# Patient Record
Sex: Female | Born: 1960 | Race: White | Hispanic: No | Marital: Married | Smoking: Never smoker
Health system: Southern US, Community
[De-identification: ages and names within clinical notes are randomized; demographics above are authoritative.]

## PROBLEM LIST (undated history)

## (undated) DIAGNOSIS — M1990 Unspecified osteoarthritis, unspecified site: Secondary | ICD-10-CM

## (undated) DIAGNOSIS — K219 Gastro-esophageal reflux disease without esophagitis: Secondary | ICD-10-CM

## (undated) HISTORY — PX: CHOLECYSTECTOMY: SHX55

## (undated) HISTORY — PX: APPENDECTOMY: SHX54

---

## 1998-07-20 ENCOUNTER — Emergency Department (HOSPITAL_COMMUNITY): Admission: EM | Admit: 1998-07-20 | Discharge: 1998-07-20 | Payer: Self-pay | Admitting: Emergency Medicine

## 2004-07-06 ENCOUNTER — Ambulatory Visit (HOSPITAL_COMMUNITY): Admission: RE | Admit: 2004-07-06 | Discharge: 2004-07-06 | Payer: Self-pay | Admitting: Family Medicine

## 2020-03-20 ENCOUNTER — Ambulatory Visit: Payer: Self-pay | Admitting: Family Medicine

## 2020-03-20 ENCOUNTER — Encounter: Payer: Self-pay | Admitting: Family Medicine

## 2020-03-20 ENCOUNTER — Other Ambulatory Visit: Payer: Self-pay

## 2020-03-20 DIAGNOSIS — M5126 Other intervertebral disc displacement, lumbar region: Secondary | ICD-10-CM

## 2020-03-20 MED ORDER — TRAMADOL HCL 50 MG PO TABS
50.0000 mg | ORAL_TABLET | Freq: Four times a day (QID) | ORAL | 0 refills | Status: DC | PRN
Start: 2020-03-20 — End: 2020-04-21

## 2020-03-20 NOTE — Progress Notes (Signed)
   Office Visit Note   Patient: Jean Tanner           Date of Birth: 1960/12/10           MRN: [id number] Visit Date: 03/20/2020 Requested by: No referring provider defined for this encounter. PCP: No primary care provider on file.  Subjective: Chief Complaint  Patient presents with  . Lower Back - Pain    Pain started in the right hip and leg since April 2020. Shooting pain down the leg (lateral thigh to the knee). No numbness/tingling. Brought [facility] MRI CD.    HPI: She is here with low back and right leg pain.  Symptoms started around April 2020.  She was living in [place] at that time, there was no injury to cause her pain.  She began seeing an orthopedist who ordered an MRI scan showing a lumbar disc protrusion.  She has been working with an Ship broker and a Land and has gotten temporary relief.  Her mother's health has not been good so she and her husband recently moved back here to help care for her mother.  Her pain seems to be getting worse, she is walking with a limp.  Her quality of life is not good because of her pain.  She is taking tramadol fairly regularly and Flexeril.  She is an Dealer.  She also wants to establish primary care in the near future.                ROS:   All other systems were reviewed and are negative.  Objective: Vital Signs: There were no vitals taken for this visit.  Physical Exam:  General:  Alert and oriented, in no acute distress. Pulm:  Breathing unlabored. Psy:  Normal mood, congruent affect.  She walks with an antalgic gait. Low back: She does have some tenderness in the right posterior hip.  No significant midline tenderness.  She has some stiffness with passive hip internal rotation but this does not cause severe pain.  She has more pain with external rotation.  Lower extremity strength and reflexes are normal.    Imaging: She brought an MRI on CD showing a couple disc protrusions with stenosis.   Assessment &  Plan: 1.  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.  She would also like to be referred for an epidural steroid injection.  Refilled tramadol, gave her baclofen to take as needed.      Procedures: No procedures performed  No notes on file     PMFS History: There are no problems to display for this patient.  History reviewed. No pertinent past medical history.  History reviewed. No pertinent family history.  History reviewed. No pertinent surgical history. Social History   Occupational History  . Not on file  Tobacco Use  . Smoking status: Not on file  Substance and Sexual Activity  . Alcohol use: Not on file  . Drug use: Not on file  . Sexual activity: Not on file

## 2020-03-23 MED ORDER — BACLOFEN 10 MG PO TABS
5.0000 mg | ORAL_TABLET | Freq: Three times a day (TID) | ORAL | 3 refills | Status: DC | PRN
Start: 2020-03-23 — End: 2020-04-21

## 2020-03-24 ENCOUNTER — Telehealth: Payer: Self-pay | Admitting: Physical Medicine and Rehabilitation

## 2020-03-24 NOTE — Telephone Encounter (Signed)
Pt called checking on the status of getting an apt with Dr. Alvester Morin.   Pt is ready for scheduling and is awaiting a call

## 2020-03-24 NOTE — Telephone Encounter (Signed)
Pt called stating she was waiting for a call to set an appt but the call went to her husband so she missed it; pt would like to have her cell number called next CB please  [phone]

## 2020-03-24 NOTE — Telephone Encounter (Signed)
Called pt back the number that the pt has in her file isnt correct. Jean Tanner been calling her husband line to have her to call us back. Pt needs to updated her phone number.

## 2020-04-01 ENCOUNTER — Telehealth: Payer: Self-pay | Admitting: Physical Medicine and Rehabilitation

## 2020-04-01 NOTE — Telephone Encounter (Signed)
Called pt back and she verify her appt.

## 2020-04-01 NOTE — Telephone Encounter (Signed)
Patient called requesting a call back. Patient has questions about upcoming injections. Please call patient at [phone].

## 2020-04-02 ENCOUNTER — Ambulatory Visit: Payer: Self-pay

## 2020-04-02 ENCOUNTER — Other Ambulatory Visit: Payer: Self-pay

## 2020-04-02 ENCOUNTER — Ambulatory Visit: Payer: Self-pay | Admitting: Physical Medicine and Rehabilitation

## 2020-04-02 ENCOUNTER — Encounter: Payer: Self-pay | Admitting: Physical Medicine and Rehabilitation

## 2020-04-02 VITALS — BP 157/100 | HR 75

## 2020-04-02 DIAGNOSIS — M25551 Pain in right hip: Secondary | ICD-10-CM

## 2020-04-02 MED ORDER — BUPIVACAINE HCL 0.25 % IJ SOLN
4.0000 mL | INTRAMUSCULAR | Status: AC | PRN
  Administered 2020-04-02: 4 mL via INTRA_ARTICULAR

## 2020-04-02 MED ORDER — TRIAMCINOLONE ACETONIDE 40 MG/ML IJ SUSP
60.0000 mg | INTRAMUSCULAR | Status: AC | PRN
  Administered 2020-04-02: 60 mg via INTRA_ARTICULAR

## 2020-04-02 NOTE — Progress Notes (Signed)
Jean Tanner - 59 y.o. female MRN [id number]  Date of birth: Dec 17, 1960  Office Visit Note: Visit Date: 04/02/2020 PCP: No primary care provider on file. Referred by: Lavada Mesi, MD  Subjective: Chief Complaint  Patient presents with  . Lower Back - Pain  . Right Hip - Pain  . Right Leg - Pain   HPI:  Jean Tanner is a 59 y.o. female who comes in today For possible L5 transforaminal epidural steroid injection at the request of Dr. Casimiro Needle Hilts.  Patient is present with her husband who is a family doctor.  She has brought MRI CD to Dr. Prince Rome who dictated that there were a couple of disc protrusions but he did not note any specific findings.  I had asked during the referral process to have the CD available but she reports that she really did not get that notification.  With talking with her and her husband today she has a lot of pain in the anterior lateral aspect of the right hip area into the groin down to the knee some pain on the lateral aspect.  Worse getting in and out of the car and walking going from sit to stand.  Once on occasion she will have more of an L4 distribution then in the medial part of the knee.  She reports prior evaluation of her hip including MRI of the hip but we do not have that to look at.  She was told she had some arthritis but she did not think she was having pain from her hip because she did have a hip injection at that practice that she reports "did not touch it ".  Everything clinically today really points to her hip as a source of pain.  She has Trendelenburg gait with ambulation she has pain with internal rotation and external.  Dr. Prince Rome looked at her hip a little bit when he saw her and she did have pain with external rotation as well.  She has decent movement of the hip although it is somewhat tight.  We decided today to elect to do diagnostic and hopefully therapeutic intra-articular hip injection.  ROS Otherwise per HPI.  Assessment &  Plan: Visit Diagnoses: No diagnosis found.  Plan: No additional findings.   Meds & Orders: No orders of the defined types were placed in this encounter.  No orders of the defined types were placed in this encounter.   Follow-up: No follow-ups on file.   Procedures: Large Joint Inj: R hip joint on 04/02/2020 3:02 PM Indications: diagnostic evaluation and pain Details: 22 G 3.5 in needle, fluoroscopy-guided anterior approach  Arthrogram: No  Medications: 4 mL bupivacaine 0.25 %; 60 mg triamcinolone acetonide 40 MG/ML Outcome: tolerated well, no immediate complications  There was excellent flow of contrast producing a partial arthrogram of the hip. The patient did have relief of symptoms during the anesthetic phase of the injection. Procedure, treatment alternatives, risks and benefits explained, specific risks discussed. Consent was given by the patient. Immediately prior to procedure a time out was called to verify the correct patient, procedure, equipment, support staff and site/side marked as required. Patient was prepped and draped in the usual sterile fashion.          Clinical History: No specialty comments available.     Objective:  VS:  HT:    WT:   BMI:     BP:(!) 157/100  HR:75bpm  TEMP: ( )  RESP:  Physical Exam   Imaging:  No results found.

## 2020-04-02 NOTE — Progress Notes (Signed)
Pt state lower back pain that travels down her right hip to her right leg. Pt state walking makes the pain worse. Pt state she takes pain meds and she sits down to help ease the pain.  Numeric Pain Rating Scale and Functional Assessment Average Pain 5   In the last MONTH (on 0-10 scale) has pain interfered with the following?  1. General activity like being  able to carry out your everyday physical activities such as walking, climbing stairs, carrying groceries, or moving a chair?  Rating(9)   +Driver, -BT, -Dye Allergies.

## 2020-04-06 ENCOUNTER — Telehealth: Payer: Self-pay | Admitting: Family Medicine

## 2020-04-06 MED ORDER — AMOXICILLIN-POT CLAVULANATE 875-125 MG PO TABS: 1.0000 | ORAL_TABLET | Freq: Two times a day (BID) | ORAL | 0 refills | Status: DC

## 2020-04-06 NOTE — Telephone Encounter (Signed)
I called and advised her it was sent to [facility].

## 2020-04-06 NOTE — Telephone Encounter (Signed)
Please advise 

## 2020-04-06 NOTE — Telephone Encounter (Signed)
Augmentin

## 2020-04-06 NOTE — Telephone Encounter (Signed)
Rx sent.  Better to fast for blood draw.

## 2020-04-06 NOTE — Telephone Encounter (Signed)
I called - medication sent in. Fasting would be best for the lab work (drink plenty of fluids, though).

## 2020-04-06 NOTE — Telephone Encounter (Signed)
Pt called stating she has a Sinus infection and would like some medication sent in for it.  I scheduled an apt for the pt to establish care for pt she has been seen prior for her hip.   She is requesting to have Augmentin called in so she can be well before christmas.   Pt would also like to know if she needs to fast before her apt on the 14 at 8:20 if she is going to have a blood draw

## 2020-04-06 NOTE — Addendum Note (Signed)
Addended by: Lillia Carmel on: 04/06/2020 04:50 PM   Modules accepted: Orders

## 2020-04-06 NOTE — Telephone Encounter (Signed)
Pt called and her prescription was called to [facility] on [address] but it needed to be sent to [facility] and the number is [phone] for the pharmacy.

## 2020-04-21 ENCOUNTER — Encounter: Payer: Self-pay | Admitting: Family Medicine

## 2020-04-21 MED ORDER — BACLOFEN 10 MG PO TABS: 5.0000 mg | ORAL_TABLET | Freq: Three times a day (TID) | ORAL | 3 refills | Status: AC | PRN

## 2020-04-21 MED ORDER — TRAMADOL HCL 50 MG PO TABS: 50.0000 mg | ORAL_TABLET | Freq: Four times a day (QID) | ORAL | 0 refills | Status: DC | PRN

## 2020-04-27 ENCOUNTER — Ambulatory Visit: Payer: Self-pay | Admitting: Physical Medicine and Rehabilitation

## 2020-05-01 ENCOUNTER — Ambulatory Visit (INDEPENDENT_AMBULATORY_CARE_PROVIDER_SITE_OTHER): Payer: Self-pay | Admitting: Family Medicine

## 2020-05-01 ENCOUNTER — Other Ambulatory Visit: Payer: Self-pay

## 2020-05-01 ENCOUNTER — Encounter: Payer: Self-pay | Admitting: Family Medicine

## 2020-05-01 VITALS — BP 160/94 | HR 72 | Ht 65.0 in | Wt 261.4 lb

## 2020-05-01 DIAGNOSIS — E559 Vitamin D deficiency, unspecified: Secondary | ICD-10-CM

## 2020-05-01 DIAGNOSIS — Z0000 Encounter for general adult medical examination without abnormal findings: Secondary | ICD-10-CM

## 2020-05-01 DIAGNOSIS — M25551 Pain in right hip: Secondary | ICD-10-CM

## 2020-05-01 DIAGNOSIS — Z6841 Body Mass Index (BMI) 40.0 and over, adult: Secondary | ICD-10-CM

## 2020-05-01 DIAGNOSIS — Z8262 Family history of osteoporosis: Secondary | ICD-10-CM

## 2020-05-01 MED ORDER — TRAMADOL HCL ER 100 MG PO TB24: 100.0000 mg | ORAL_TABLET | Freq: Every day | ORAL | 3 refills | Status: DC

## 2020-05-01 NOTE — Progress Notes (Signed)
Office Visit Note   Patient: Jean Tanner           Date of Birth: [birth year]           MRN: [id number] Visit Date: 05/01/2020 Requested by: No referring provider defined for this encounter. PCP: No primary care provider on file.  Subjective: Chief Complaint  Patient presents with  . establish primary care    Right hip is no better post injection from Dr. Alvester Morin. Would like to discuss hip replacement &/or knee replacement surgeries  BP has been running high, especially with the hip & knee pain    HPI: She is here for a wellness exam.  Her last exam was about 2 years ago.  She is still struggling with ongoing hip pain.  She had a diagnostic injection per Dr. Alvester Morin.  She states that for the next 5 days she felt great.  She was even able to walk up steps without pain.  But the relief was very short-lived and now she is hurting almost more than she was before.  The pain travels toward the knee.  She has been struggling with obesity.  Her current BMI is 43.5.  Based on her height of 5 foot 5 inches, she needs to weigh 240 pounds in order to qualify for a joint replacement.  Her blood pressure has been periodically elevated.  She does not want to take medication for this.  She thinks that if she loses weight, her pressure will normalize.  Family history was reviewed in detail.  She is up-to-date on colonoscopy which was normal.  She is up-to-date on eye exams and dental exams.  She does not get mammograms.  She does self exams, and her husband who is a physician also does periodic exams.                ROS:   All other systems were reviewed and are negative.  Objective: Vital Signs: BP (!) 160/94   Pulse 72   Ht 5\' 5"  (1.651 m)   Wt 261 lb 6.4 oz (118.6 kg)   BMI 43.50 kg/m   Physical Exam:  General:  Alert and oriented, in no acute distress. Pulm:  Breathing unlabored. Psy:  Normal mood, congruent affect. Skin: No suspicious lesions HEENT:  [place]/AT, PERRLA, EOM Full, no  nystagmus.  Funduscopic examination within normal limits.  No conjunctival erythema.  Tympanic membranes are pearly gray with normal landmarks.  External ear canals are normal.  Nasal passages are clear.  Oropharynx is clear.  No significant lymphadenopathy.  No thyromegaly or nodules.  2+ carotid pulses without bruits. CV: Regular rate and rhythm without murmurs, rubs, or gallops.  No peripheral edema.  2+ radial and posterior tibial pulses. Lungs: Clear to auscultation throughout with no wheezing or areas of consolidation. Abd: Bowel sounds are active, no hepatosplenomegaly or masses.  Soft and nontender.  No audible bruits.  No evidence of ascites. Extremities: 2+ upper and 1+ lower extremity DTRs.  No nail deformities.   Imaging: No results found.  Assessment & Plan: 1. wellness exam -Labs drawn today.  2.  Obesity -She will start minimizing intake of processed carbohydrates.  3. right hip pain with DJD -She will most likely need a replacement.  We will treat with extended release tramadol, with immediate release for breakthrough pain.  She will continue to work aggressively on weight loss.  Once she reaches BMI below 40, we will have her consult with Dr. .  4. vitamin  D deficiency -We will screen for this.  She is presently taking 5000 units daily.     Procedures: No procedures performed        PMFS History: There are no problems to display for this patient.  No past medical history on file.  Family History  Problem Relation Age of Onset  . Heart disease Mother   . Arthritis Mother   . Osteoporosis Mother   . Hypothyroidism Mother   . Cancer Father   . Obesity Father   . Diabetes Father   . Lung cancer Father   . Healthy Sister   . Healthy Brother   . Diabetes Paternal Grandfather   . Healthy Sister     No past surgical history on file. Social History   Occupational History  . Not on file  Tobacco Use  . Smoking status: Not on file  . Smokeless  tobacco: Not on file  Substance and Sexual Activity  . Alcohol use: Not on file  . Drug use: Not on file  . Sexual activity: Not on file

## 2020-05-02 LAB — LIPID PANEL
Cholesterol: 303 mg/dL — ABNORMAL HIGH (ref <200)
HDL: 78 mg/dL (ref >=50)
LDL Cholesterol (Calc): 207 mg/dL (calc) — ABNORMAL HIGH
Non-HDL Cholesterol (Calc): 225 mg/dL (calc) — ABNORMAL HIGH (ref <130)
Total CHOL/HDL Ratio: 3.9 (calc) (ref <5.0)
Triglycerides: 72 mg/dL (ref <150)

## 2020-05-02 LAB — COMPREHENSIVE METABOLIC PANEL
AG Ratio: 1.7 (calc) (ref 1.0–2.5)
ALT: 39 U/L — ABNORMAL HIGH (ref 6–29)
AST: 24 U/L (ref 10–35)
Albumin: 4.3 g/dL (ref 3.6–5.1)
Alkaline phosphatase (APISO): 80 U/L (ref 37–153)
BUN: 14 mg/dL (ref 7–25)
CO2: 29 mmol/L (ref 20–32)
Calcium: 9.6 mg/dL (ref 8.6–10.4)
Chloride: 101 mmol/L (ref 98–110)
Creat: 0.72 mg/dL (ref 0.50–1.05)
Globulin: 2.5 g/dL (calc) (ref 1.9–3.7)
Glucose, Bld: 86 mg/dL (ref 65–99)
Potassium: 4.3 mmol/L (ref 3.5–5.3)
Sodium: 138 mmol/L (ref 135–146)
Total Bilirubin: 0.5 mg/dL (ref 0.2–1.2)
Total Protein: 6.8 g/dL (ref 6.1–8.1)

## 2020-05-02 LAB — CBC WITH DIFFERENTIAL/PLATELET
Absolute Monocytes: 317 cells/uL (ref 200–950)
Basophils Absolute: 38 cells/uL (ref 0–200)
Basophils Relative: 0.8 %
Eosinophils Absolute: 206 cells/uL (ref 15–500)
Eosinophils Relative: 4.3 %
HCT: 40.1 % (ref 35.0–45.0)
Hemoglobin: 13.9 g/dL (ref 11.7–15.5)
Lymphs Abs: 2083 cells/uL (ref 850–3900)
MCH: 32.3 pg (ref 27.0–33.0)
MCHC: 34.7 g/dL (ref 32.0–36.0)
MCV: 93.3 fL (ref 80.0–100.0)
MPV: 9.6 fL (ref 7.5–12.5)
Monocytes Relative: 6.6 %
Neutro Abs: 2155 cells/uL (ref 1500–7800)
Neutrophils Relative %: 44.9 %
Platelets: 309 10*3/uL (ref 140–400)
RBC: 4.3 10*6/uL (ref 3.80–5.10)
RDW: 13.1 % (ref 11.0–15.0)
Total Lymphocyte: 43.4 %
WBC: 4.8 10*3/uL (ref 3.8–10.8)

## 2020-05-02 LAB — THYROID PANEL WITH TSH
Free Thyroxine Index: 2.9 (ref 1.4–3.8)
T3 Uptake: 28 % (ref 22–35)
T4, Total: 10.4 ug/dL (ref 5.1–11.9)
TSH: 2.22 mIU/L (ref 0.40–4.50)

## 2020-05-02 LAB — VITAMIN D 25 HYDROXY (VIT D DEFICIENCY, FRACTURES): Vit D, 25-Hydroxy: 30 ng/mL (ref 30–100)

## 2020-05-02 LAB — HIGH SENSITIVITY CRP: hs-CRP: 8.7 mg/L — ABNORMAL HIGH

## 2020-05-02 LAB — HEMOGLOBIN A1C
Hgb A1c MFr Bld: 5.7 % of total Hgb — ABNORMAL HIGH (ref <5.7)
Mean Plasma Glucose: 117 mg/dL
eAG (mmol/L): 6.5 mmol/L

## 2020-05-03 ENCOUNTER — Telehealth: Payer: Self-pay | Admitting: Family Medicine

## 2020-05-03 NOTE — Telephone Encounter (Signed)
Vitamin D is low (goal level is 50-80).  I recommend an additional 2,000-5,000 IU daily.  Recheck in 6 months.  CBC looks good.  CMET virtually normal - very slight elevation of ALT.  Will probably resolve with weight loss.  CRP is elevated (inflammation).  Lipids are elevated (total and LDL), but HDL and TG look great.  Could consider ordering a "fractionated lipid panel" to further evaluate, or possibly a CT Calcium Score test (to look for signs of plaque build-up in the coronary arteries).  Thyroid function looks normal.  A1C is in prediabetes range.  Should be manageable with diet & exercise.

## 2020-06-10 ENCOUNTER — Encounter: Payer: Self-pay | Admitting: Family Medicine

## 2020-06-10 MED ORDER — TRAMADOL HCL 50 MG PO TABS: 50.0000 mg | ORAL_TABLET | Freq: Three times a day (TID) | ORAL | 0 refills | Status: DC | PRN

## 2020-06-11 ENCOUNTER — Ambulatory Visit (INDEPENDENT_AMBULATORY_CARE_PROVIDER_SITE_OTHER): Payer: Self-pay

## 2020-06-11 ENCOUNTER — Ambulatory Visit (INDEPENDENT_AMBULATORY_CARE_PROVIDER_SITE_OTHER): Payer: Self-pay | Admitting: Orthopaedic Surgery

## 2020-06-11 ENCOUNTER — Ambulatory Visit: Payer: Self-pay

## 2020-06-11 ENCOUNTER — Encounter: Payer: Self-pay | Admitting: Orthopaedic Surgery

## 2020-06-11 VITALS — Ht 65.0 in | Wt 261.0 lb

## 2020-06-11 DIAGNOSIS — G8929 Other chronic pain: Secondary | ICD-10-CM

## 2020-06-11 DIAGNOSIS — M25551 Pain in right hip: Secondary | ICD-10-CM

## 2020-06-11 DIAGNOSIS — Z6841 Body Mass Index (BMI) 40.0 and over, adult: Secondary | ICD-10-CM

## 2020-06-11 DIAGNOSIS — M25561 Pain in right knee: Secondary | ICD-10-CM

## 2020-06-11 NOTE — Progress Notes (Signed)
Office Visit Note   Patient: Jean Tanner           Date of Birth: May 15, 1960           MRN: [id number] Visit Date: 06/11/2020              Requested by: No referring provider defined for this encounter. PCP: No primary care provider on file.   Assessment & Plan: Visit Diagnoses:  1. Pain in right hip   2. Chronic pain of right knee   3. Class 3 severe obesity due to excess calories without serious comorbidity with body mass index (BMI) of 40.0 to 44.9 in adult [facility])     Plan: I did talk to her in length in detail about hip replacement surgery.  I would like to see her back in 6 weeks to have her repeat a weight and BMI calculation.  If she shows some weight loss that we can record we can then more easily set her up replacement surgery.  I went over her x-rays with her in detail and showed her hip model.  We talked about hip replacement surgery and the intraoperative and postoperative course and what to expect.  We talked about the risk and benefits of this type of surgery as well.  All questions and concerns were answered and addressed.  We will reevaluate her in 6 weeks again with a repeat BMI and weight calculation.  Follow-Up Instructions: Return in about 6 weeks (around 07/23/2020).   Orders:  Orders Placed This Encounter  Procedures  . XR HIP UNILAT W OR W/O PELVIS 1V RIGHT  . XR Knee 1-2 Views Right   No orders of the defined types were placed in this encounter.     Procedures: No procedures performed   Clinical Data: No additional findings.   Subjective: Chief Complaint  Patient presents with  . Right Hip - Pain  . 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  She is also been dealing with right knee pain.  She has had an intra-articular steroid injection which helped for maybe about 6 days.  She is not a diabetic.  She has walk with a cane.  Her  right hip pain is daily and is detrimentally affecting her mobility, her quality of life and her actives daily living.  Her pain is 10 out of 10.  Her husband who is a physician is here with her today.  At this point she does wish to proceed with a discussion about hip replacement surgery.  She has been having some right knee pain as well.  Her hip hurts with internal and external rotation she states.  It hurts with pivoting and she has a hard time getting comfortable sleep at night.  There is been no injury to the hip.  Is been slowly getting worse over several years now.  HPI  Review of Systems There is currently listed no headache, chest pain, shortness of breath, fever, chills, nausea, vomiting  Objective: Vital Signs: Ht 5\' 5"  (1.651 m)   Wt 261 lb (118.4 kg)   BMI 43.43 kg/m   Physical Exam She is alert and orient x3 and in no acute distress Ortho Exam Examination of her right hip shows essentially almost no internal and external rotation due to significant stiffness.  I can rotate her but is very painful and there is some  grinding of the hip joint as well.  Her right knee shows some slight varus malalignment with medial joint line tenderness and patellofemoral crepitation.  When I have her lay in a supine position her leg lengths are surprisingly near equal.  I think she is probably shorter on the right with her having valgus malalignment of her left knee.  I am able to easily mobilize her abdomen to get her thigh which is not large. Specialty Comments:  No specialty comments available.  Imaging: XR HIP UNILAT W OR W/O PELVIS 1V RIGHT  Result Date: 06/11/2020 An AP pelvis and lateral right hip show severe end-stage arthritis of the right hip with significant valgus malalignment of the femoral neck and uncovering of the femoral head.  There is bone-on-bone wear of the joint space itself with cystic changes in the femoral head and acetabulum and complete loss of the joint space.  XR  Knee 1-2 Views Right  Result Date: 06/11/2020 2 views of the right knee show tricompartment arthritis with medial joint space narrowing and patellofemoral narrowing    PMFS History: There are no problems to display for this patient.  History reviewed. No pertinent past medical history.  Family History  Problem Relation Age of Onset  . Heart disease Mother   . Arthritis Mother   . Osteoporosis Mother   . Hypothyroidism Mother   . Cancer Father   . Obesity Father   . Diabetes Father   . Lung cancer Father   . Healthy Sister   . Healthy Brother   . Diabetes Paternal Grandfather   . Healthy Sister     History reviewed. No pertinent surgical history. Social History   Occupational History  . Not on file  Tobacco Use  . Smoking status: Not on file  . Smokeless tobacco: Not on file  Substance and Sexual Activity  . Alcohol use: Not on file  . Drug use: Not on file  . Sexual activity: Not on file

## 2020-07-06 ENCOUNTER — Other Ambulatory Visit: Payer: Self-pay | Admitting: Family Medicine

## 2020-07-06 MED ORDER — TRAMADOL HCL 50 MG PO TABS: 50.0000 mg | ORAL_TABLET | Freq: Three times a day (TID) | ORAL | 0 refills | Status: DC | PRN

## 2020-07-06 NOTE — Telephone Encounter (Signed)
MH

## 2020-07-14 ENCOUNTER — Encounter: Payer: Self-pay | Admitting: Family Medicine

## 2020-07-14 MED ORDER — TRAMADOL HCL ER 100 MG PO TB24: 100.0000 mg | ORAL_TABLET | Freq: Every day | ORAL | 1 refills | Status: DC

## 2020-07-23 ENCOUNTER — Encounter: Payer: Self-pay | Admitting: Orthopaedic Surgery

## 2020-07-23 ENCOUNTER — Ambulatory Visit (INDEPENDENT_AMBULATORY_CARE_PROVIDER_SITE_OTHER): Payer: Self-pay | Admitting: Orthopaedic Surgery

## 2020-07-23 ENCOUNTER — Other Ambulatory Visit: Payer: Self-pay | Admitting: Orthopaedic Surgery

## 2020-07-23 VITALS — Ht 65.0 in | Wt 239.0 lb

## 2020-07-23 DIAGNOSIS — M1611 Unilateral primary osteoarthritis, right hip: Secondary | ICD-10-CM

## 2020-07-23 MED ORDER — TRAMADOL HCL 50 MG PO TABS: 100.0000 mg | ORAL_TABLET | Freq: Four times a day (QID) | ORAL | 0 refills | Status: DC | PRN

## 2020-07-23 NOTE — Progress Notes (Signed)
The patient is well-known to me.  She has debilitating end-stage arthritis of her right hip this is been well-documented.  Her x-rays show complete loss of joint space with cystic change in the femoral head and acetabulum and almost a subluxed position of the femoral head.  Her pain is daily and is 10 out of 10 with the right hip.  It is definitely affecting her mobility, quality of life and her actives daily living.  She has tried conservative treatment for over 12 months now.  This is included weight loss, activity modification, using assistive device with a cane or opposite hand as well as pain medications and anti-inflammatory medications.  She has been through hip strengthening exercises as well.  At her last visit her BMI was just over 43.  She has lost significant of weight and her BMI is down to 39.7.  She is not a diabetic.  At her last visit we talked in detail about hip replacement surgery and described what the surgery involves.  She is fully aware of what to expect from her interoperative postoperative course standpoint.  We talked about the risk and benefits of surgery in detail.  She has had no other acute change in medical status.  There is no listed headache, chest pain, shortness of breath, fever, chills, nausea, vomiting.  I did review the x-rays of the right hip showing the severe end-stage arthritis of the right hip with complete loss of joint space as well as cystic changes and peritracheal osteophytes.  Her right hip has significant limitations with motion and severe pain with attempts of motion.  At this point I feel comfortable with proceeding with surgery.  She still understands there is a high risk of acute blood loss anemia, fracture, infection, DVT, implant failure, dislocation and skin and soft tissue issues given her obesity.  She understands her goals are to decrease pain improve mobility and overall proved quality of life.  We will work on getting this scheduled in the near  future and she will continue her weight loss journey.  All questions and concerns were answered and addressed.

## 2020-08-05 ENCOUNTER — Encounter: Payer: Self-pay | Admitting: Family Medicine

## 2020-08-06 ENCOUNTER — Telehealth: Payer: Self-pay | Admitting: Family Medicine

## 2020-08-06 MED ORDER — TRAMADOL HCL ER 100 MG PO TB24: 100.0000 mg | ORAL_TABLET | Freq: Every day | ORAL | 0 refills | Status: DC

## 2020-08-06 MED ORDER — TRAMADOL HCL 50 MG PO TABS: 100.0000 mg | ORAL_TABLET | Freq: Three times a day (TID) | ORAL | 0 refills | Status: DC | PRN

## 2020-08-06 NOTE — Telephone Encounter (Signed)
I spoke with the patient. She said she received an enormous bill for the blood work done at her wellness visit on 05/01/20. I have reached out to  [facility] with [facility] for help on this. We do not have a self-pay stamp. I had to leave a voice mail for her to call me back regarding this.

## 2020-08-06 NOTE — Telephone Encounter (Signed)
Patient called requesting a call back from Terri. Patient states she was  advised to call Terri because she is a self pay pt and was told her lab orders was suppose to be stamped as self pay. Please call patient back at [phone].

## 2020-08-07 NOTE — Telephone Encounter (Signed)
I called and left a full message on the patient's voice mail: I spoke with Briscoe Deutscher this morning. She is going to pull the patient's accession and get with the billing department with [facility]. She will send me an email with the outcome in 3-5 business days. Also, she is making sure one of the stamps is being sent to our office for future use. Just being "self-pay" does not mean [facility] automatically gives a discount, because not everyone qualifies for the discount, per Chip Boer. I will call the patient back once I receive the email.

## 2020-08-13 NOTE — Progress Notes (Signed)
Sent message, via epic in basket, requesting orders in epic from surgeon.  

## 2020-08-14 ENCOUNTER — Other Ambulatory Visit: Payer: Self-pay | Admitting: Physician Assistant

## 2020-08-17 NOTE — Progress Notes (Addendum)
PCP - Lavada Mesi MD  Cardiologist - no  PPM/ICD -  Device Orders -  Rep Notified -   Chest x-ray -  EKG - 08-20-20 Stress Test -  ECHO -  Cardiac Cath -   Sleep Study -  CPAP -   Fasting Blood Sugar -  Checks Blood Sugar _____ times a day  Blood Thinner Instructions: Aspirin Instructions:  ERAS Protcol - PRE-SURGERY Ensure or G2-   COVID TEST- 5-11  Activity--Able to walk a flight of stairs without SOB  Anesthesia review: pt. BP elevated at preop Shanda Bumps PA aware pt. Advised to contact PCP and monitor BP  at home   Patient denies shortness of breath, fever, cough and chest pain at PAT appointment   All instructions explained to the patient, with a verbal understanding of the material. Patient agrees to go over the instructions while at home for a better understanding. Patient also instructed to self quarantine after being tested for COVID-19. The opportunity to ask questions was provided.

## 2020-08-17 NOTE — Patient Instructions (Signed)
DUE TO COVID-19 ONLY ONE VISITOR IS ALLOWED TO COME WITH YOU AND STAY IN THE WAITING ROOM ONLY DURING PRE OP AND PROCEDURE DAY OF SURGERY.   TWO  VISITOR  MAY VISIT WITH YOU AFTER SURGERY IN YOUR PRIVATE ROOM DURING VISITING HOURS ONLY!  YOU NEED TO HAVE A COVID 19 TEST ON_5-11-22______ @_______ , THIS TEST MUST BE DONE BEFORE SURGERY,   COVID TESTING SITE [address] [place] ,   IT IS ON THE RIGHT GOING OUT [address] APPROXIMATELY  2 MINUTES PAST [employer] ON THE RIGHT. ONCE YOUR COVID TEST IS COMPLETED,  PLEASE BEGIN THE QUARANTINE INSTRUCTIONS AS OUTLINED IN YOUR HANDOUT.                IDA MILBRATH  08/17/2020   Your procedure is scheduled on: 08-28-20   Report to [facility] Main  Entrance   Report to admitting at           0715   AM     Call this number if you have problems the morning of surgery [phone]    Remember: NO SOLID FOOD AFTER MIDNIGHT THE NIGHT PRIOR TO SURGERY. NOTHING BY MOUTH EXCEPT CLEAR LIQUIDS UNTIL  0645 am .  PLEASE FINISH ENSURE DRINK PER SURGEON ORDER  WHICH NEEDS TO BE COMPLETED AT    0645 am then nothing by mouth    CLEAR LIQUID DIET   Foods Allowed                                                                          Foods Excluded  Black Coffee and tea, regular and decaf                             liquids that you cannot  Plain Jell-O any favor except red or purple                                           see through such as: Fruit ices (not with fruit pulp)                                                    milk, soups, orange juice  Iced Popsicles                                                       All solid food Carbonated beverages, regular and diet                                    Cranberry, grape and apple juices Sports drinks like Gatorade Lightly seasoned clear broth or consume(fat free) Sugar, honey syrup  __________    .  BRUSH YOUR TEETH MORNING OF SURGERY AND RINSE  YOUR MOUTH OUT, NO CHEWING GUM CANDY OR MINTS.     Take these medicines the morning of surgery with A SIP OF WATER:   DO NOT TAKE ANY DIABETIC MEDICATIONS DAY OF YOUR SURGERY                               You may not have any metal on your body including hair pins and              piercings  Do not wear jewelry, make-up, lotions, powders or perfumes, deodorant             Do not wear nail polish on your fingernails.  Do not shave  48 hours prior to surgery.     Do not bring valuables to the hospital. [facility] IS NOT             RESPONSIBLE   FOR VALUABLES.  Contacts, dentures or bridgework may not be worn into surgery.      Patients discharged the day of surgery will not be allowed to drive home. IF YOU ARE HAVING SURGERY AND GOING HOME THE SAME DAY, YOU MUST HAVE AN ADULT TO DRIVE YOU HOME AND BE WITH YOU FOR 24 HOURS. YOU MAY GO HOME BY TAXI OR UBER OR ORTHERWISE, BUT AN ADULT MUST ACCOMPANY YOU HOME AND STAY WITH YOU FOR 24 HOURS.  Name and phone number of your driver:  Special Instructions: N/A              Please read over the following fact sheets you were given: _____________________________________________________________________             [facility] - Preparing for Surgery Before surgery, you can play an important role.  Because skin is not sterile, your skin needs to be as free of germs as possible.  You can reduce the number of germs on your skin by washing with CHG (chlorahexidine gluconate) soap before surgery.  CHG is an antiseptic cleaner which kills germs and bonds with the skin to continue killing germs even after washing. Please DO NOT use if you have an allergy to CHG or antibacterial soaps.  If your skin becomes reddened/irritated stop using the CHG and inform your nurse when you arrive at Short Stay. Do not shave (including legs and underarms) for at least 48 hours prior to the first CHG shower.  You may shave your face/neck. Please follow these instructions  carefully:  1.  Shower with CHG Soap the night before surgery and the  morning of Surgery.  2.  If you choose to wash your hair, wash your hair first as usual with your  normal  shampoo.  3.  After you shampoo, rinse your hair and body thoroughly to remove the  shampoo.                           4.  Use CHG as you would any other liquid soap.  You can apply chg directly  to the skin and wash                       Gently with a scrungie or clean washcloth.  5.  Apply the CHG Soap to your body ONLY FROM THE NECK DOWN.   Do not use  on face/ open                           Wound or open sores. Avoid contact with eyes, ears mouth and genitals (private parts).                       Wash face,  Genitals (private parts) with your normal soap.             6.  Wash thoroughly, paying special attention to the area where your surgery  will be performed.  7.  Thoroughly rinse your body with warm water from the neck down.  8.  DO NOT shower/wash with your normal soap after using and rinsing off  the CHG Soap.                9.  Pat yourself dry with a clean towel.            10.  Wear clean pajamas.            11.  Place clean sheets on your bed the night of your first shower and do not  sleep with pets. Day of Surgery : Do not apply any lotions/deodorants the morning of surgery.  Please wear clean clothes to the hospital/surgery center.  FAILURE TO FOLLOW THESE INSTRUCTIONS MAY RESULT IN THE CANCELLATION OF YOUR SURGERY PATIENT SIGNATURE_________________________________  NURSE SIGNATURE__________________________________  ________________________________________________________________________   Adam Phenix  An incentive spirometer is a tool that can help keep your lungs clear and active. This tool measures how well you are filling your lungs with each breath. Taking long deep breaths may help reverse or decrease the chance of developing breathing (pulmonary) problems (especially infection)  following:  A long period of time when you are unable to move or be active. BEFORE THE PROCEDURE   If the spirometer includes an indicator to show your best effort, your nurse or respiratory therapist will set it to a desired goal.  If possible, sit up straight or lean slightly forward. Try not to slouch.  Hold the incentive spirometer in an upright position. INSTRUCTIONS FOR USE  1. Sit on the edge of your bed if possible, or sit up as far as you can in bed or on a chair. 2. Hold the incentive spirometer in an upright position. 3. Breathe out normally. 4. Place the mouthpiece in your mouth and seal your lips tightly around it. 5. Breathe in slowly and as deeply as possible, raising the piston or the ball toward the top of the column. 6. Hold your breath for 3-5 seconds or for as long as possible. Allow the piston or ball to fall to the bottom of the column. 7. Remove the mouthpiece from your mouth and breathe out normally. 8. Rest for a few seconds and repeat Steps 1 through 7 at least 10 times every 1-2 hours when you are awake. Take your time and take a few normal breaths between deep breaths. 9. The spirometer may include an indicator to show your best effort. Use the indicator as a goal to work toward during each repetition. 10. After each set of 10 deep breaths, practice coughing to be sure your lungs are clear. If you have an incision (the cut made at the time of surgery), support your incision when coughing by placing a pillow or rolled up towels firmly against it. Once you are able to get out of bed, walk around  indoors and cough well. You may stop using the incentive spirometer when instructed by your caregiver.  RISKS AND COMPLICATIONS  Take your time so you do not get dizzy or light-headed.  If you are in pain, you may need to take or ask for pain medication before doing incentive spirometry. It is harder to take a deep breath if you are having pain. AFTER USE  Rest and  breathe slowly and easily.  It can be helpful to keep track of a log of your progress. Your caregiver can provide you with a simple table to help with this. If you are using the spirometer at home, follow these instructions: [place] IF:   You are having difficultly using the spirometer.  You have trouble using the spirometer as often as instructed.  Your pain medication is not giving enough relief while using the spirometer.  You develop fever of 100.5 F (38.1 C) or higher. SEEK IMMEDIATE MEDICAL CARE IF:   You cough up bloody sputum that had not been present before.  You develop fever of 102 F (38.9 C) or greater.  You develop worsening pain at or near the incision site. MAKE SURE YOU:   Understand these instructions.  Will watch your condition.  Will get help right away if you are not doing well or get worse. Document Released: 08/15/2006 Document Revised: 06/27/2011 Document Reviewed: 10/16/2006 [facility] Patient Information 2014 [place], [place].   ________________________________________________________________________

## 2020-08-20 ENCOUNTER — Encounter: Payer: Self-pay | Admitting: Family Medicine

## 2020-08-20 ENCOUNTER — Other Ambulatory Visit: Payer: Self-pay

## 2020-08-20 ENCOUNTER — Telehealth: Payer: Self-pay | Admitting: Family Medicine

## 2020-08-20 ENCOUNTER — Encounter (HOSPITAL_COMMUNITY): Payer: Self-pay

## 2020-08-20 ENCOUNTER — Encounter (HOSPITAL_COMMUNITY)
Admission: RE | Admit: 2020-08-20 | Discharge: 2020-08-20 | Disposition: A | Discharge status: Home / Self Care | Payer: Self-pay | Source: Ambulatory Visit | Attending: Orthopaedic Surgery | Admitting: Orthopaedic Surgery

## 2020-08-20 DIAGNOSIS — Z01818 Encounter for other preprocedural examination: Secondary | ICD-10-CM | POA: Insufficient documentation

## 2020-08-20 HISTORY — DX: Gastro-esophageal reflux disease without esophagitis: K21.9

## 2020-08-20 HISTORY — DX: Unspecified osteoarthritis, unspecified site: M19.90

## 2020-08-20 LAB — SURGICAL PCR SCREEN
MRSA, PCR: NEGATIVE
Staphylococcus aureus: NEGATIVE

## 2020-08-20 LAB — CBC
HCT: 43 % (ref 36.0–46.0)
Hemoglobin: 14.1 g/dL (ref 12.0–15.0)
MCH: 31.5 pg (ref 26.0–34.0)
MCHC: 32.8 g/dL (ref 30.0–36.0)
MCV: 96 fL (ref 80.0–100.0)
Platelets: 257 10*3/uL (ref 150–400)
RBC: 4.48 MIL/uL (ref 3.87–5.11)
RDW: 13.2 % (ref 11.5–15.5)
WBC: 4.9 10*3/uL (ref 4.0–10.5)
nRBC: 0 % (ref 0.0–0.2)

## 2020-08-20 LAB — BASIC METABOLIC PANEL
Anion gap: 8 (ref 5–15)
BUN: 20 mg/dL (ref 6–20)
CO2: 27 mmol/L (ref 22–32)
Calcium: 9.9 mg/dL (ref 8.9–10.3)
Chloride: 104 mmol/L (ref 98–111)
Creatinine, Ser: 0.73 mg/dL (ref 0.44–1.00)
GFR, Estimated: >60 mL/min (ref >60)
Glucose, Bld: 96 mg/dL (ref 70–99)
Potassium: 4.4 mmol/L (ref 3.5–5.1)
Sodium: 139 mmol/L (ref 135–145)

## 2020-08-20 LAB — NO BLOOD PRODUCTS

## 2020-08-20 NOTE — Telephone Encounter (Signed)
Patient called requesting a call back from Terri. Patient need medical advice. Patient states she went for blood work and BP was high before surgery and hospital informed pt to contact PCP for possible medication to lower BP. Please call patient at [phone].

## 2020-08-21 MED ORDER — METOPROLOL TARTRATE 25 MG PO TABS: 25.0000 mg | ORAL_TABLET | Freq: Two times a day (BID) | ORAL | 2 refills | Status: AC

## 2020-08-21 NOTE — Progress Notes (Signed)
Final EKG in epic done 08/20/20.

## 2020-08-24 ENCOUNTER — Telehealth: Payer: Self-pay

## 2020-08-24 NOTE — Telephone Encounter (Signed)
She does not need to stop her multivitamin.  And I agree with her not needing postoperative physical therapy when she is out of the hospital if she is doing well.  That assessment can be made while she is in the hospital as well.

## 2020-08-24 NOTE — Telephone Encounter (Signed)
Should patient stop her multivitamin?  THA is scheduled for 08/28/20.  Also, previously discussed that she might not need P.T. post op.  Do you still agree with that?  [phone]

## 2020-08-24 NOTE — Telephone Encounter (Signed)
Sent patient a MyChart message

## 2020-08-26 ENCOUNTER — Other Ambulatory Visit (HOSPITAL_COMMUNITY)
Admission: RE | Admit: 2020-08-26 | Discharge: 2020-08-26 | Disposition: A | Discharge status: Home / Self Care | Payer: Self-pay | Source: Ambulatory Visit | Attending: Orthopaedic Surgery | Admitting: Orthopaedic Surgery

## 2020-08-26 DIAGNOSIS — Z01812 Encounter for preprocedural laboratory examination: Secondary | ICD-10-CM | POA: Insufficient documentation

## 2020-08-26 DIAGNOSIS — Z20822 Contact with and (suspected) exposure to covid-19: Secondary | ICD-10-CM | POA: Insufficient documentation

## 2020-08-26 LAB — SARS CORONAVIRUS 2 (TAT 6-24 HRS): SARS Coronavirus 2: NEGATIVE

## 2020-08-27 ENCOUNTER — Encounter: Payer: Self-pay | Admitting: Orthopaedic Surgery

## 2020-08-27 NOTE — Anesthesia Preprocedure Evaluation (Addendum)
Anesthesia Evaluation  Patient identified by MRN, date of birth, ID band Patient awake    Reviewed: Allergy & Precautions, NPO status , Patient's Chart, lab work & pertinent test results  Airway Mallampati: I  TM Distance: >3 FB Neck ROM: Full    Dental no notable dental hx. (+) Teeth Intact, Dental Advisory Given   Pulmonary neg pulmonary ROS,    Pulmonary exam normal breath sounds clear to auscultation       Cardiovascular hypertension, Pt. on medications Normal cardiovascular exam Rhythm:Regular Rate:Normal  08/20/2020 EKG SR R 69   Neuro/Psych negative neurological ROS     GI/Hepatic negative GI ROS, Neg liver ROS,   Endo/Other    Renal/GU negative Renal ROS     Musculoskeletal  (+) Arthritis , Osteoarthritis,    Abdominal (+) + obese,   Peds  Hematology Lab Results      Component                Value               Date                      WBC                      4.9                 08/20/2020                HGB                      14.1                08/20/2020                HCT                      43.0                08/20/2020                MCV                      96.0                08/20/2020                PLT                      257                 08/20/2020              Anesthesia Other Findings   Reproductive/Obstetrics                           Anesthesia Physical Anesthesia Plan  ASA: III  Anesthesia Plan: Spinal   Post-op Pain Management:    Induction:   PONV Risk Score and Plan: 3 and Treatment may vary due to age or medical condition, Midazolam, Dexamethasone and Ondansetron  Airway Management Planned: Natural Airway  Additional Equipment: None  Intra-op Plan:   Post-operative Plan:   Informed Consent: I have reviewed the patients History and Physical, chart, labs and discussed the procedure including the risks, benefits and alternatives for the  proposed anesthesia with the patient or  authorized representative who has indicated his/her understanding and acceptance.     Dental advisory given  Plan Discussed with: CRNA  Anesthesia Plan Comments: (SP)       Anesthesia Quick Evaluation

## 2020-08-27 NOTE — H&P (Signed)
TOTAL HIP ADMISSION H&P  Patient is admitted for right total hip arthroplasty.  Subjective:  Chief Complaint: right hip pain  HPI: Jean Tanner, 60 y.o. female, has a history of pain and functional disability in the right hip(s) due to arthritis and patient has failed non-surgical conservative treatments for greater than 12 weeks to include NSAID's and/or analgesics, corticosteriod injections, flexibility and strengthening excercises, use of assistive devices, weight reduction as appropriate and activity modification.  Onset of symptoms was gradual starting 3 years ago with gradually worsening course since that time.The patient noted no past surgery on the right hip(s).  Patient currently rates pain in the right hip at 10 out of 10 with activity. Patient has night pain, worsening of pain with activity and weight bearing, trendelenberg gait, pain that interfers with activities of daily living and pain with passive range of motion. Patient has evidence of subchondral cysts, subchondral sclerosis, periarticular osteophytes and joint space narrowing by imaging studies. This condition presents safety issues increasing the risk of falls.  There is no current active infection.  Patient Active Problem List   Diagnosis Date Noted  . Unilateral primary osteoarthritis, right hip 07/23/2020   Past Medical History:  Diagnosis Date  . Arthritis   . GERD (gastroesophageal reflux disease)    hx of    Past Surgical History:  Procedure Laterality Date  . APPENDECTOMY     1978  . CHOLECYSTECTOMY     2009    No current facility-administered medications for this encounter.   Current Outpatient Medications  Medication Sig Dispense Refill Last Dose  . acetaminophen (TYLENOL) 650 MG CR tablet Take 1,300 mg by mouth every 8 (eight) hours as needed for pain.     . B Complex Vitamins (B COMPLEX PO) Take 1 tablet by mouth 2 (two) times daily.     . baclofen (LIORESAL) 10 MG tablet Take 0.5-1 tablets (5-10  mg total) by mouth 3 (three) times daily as needed for muscle spasms. (Patient taking differently: Take 10 mg by mouth 3 (three) times daily as needed for muscle spasms.) 30 each 3   . Benfotiamine 150 MG CAPS Take 150 mg by mouth daily.     . Biotin 1000 MCG tablet Take 1,000 mcg by mouth in the morning and at bedtime.     Marland Kitchen CALCIUM-MAGNESIUM-VITAMIN D PO Take 1 tablet by mouth in the morning and at bedtime.     . cholecalciferol (VITAMIN D3) 25 MCG (1000 UNIT) tablet Take 1,000 Units by mouth in the morning and at bedtime.     . COLLAGEN-VITAMIN C-BIOTIN PO Take 3 capsules by mouth daily.     . diclofenac (VOLTAREN) 75 MG EC tablet Take 75 mg by mouth 2 (two) times daily.     Marland Kitchen docusate sodium (COLACE) 100 MG capsule Take 100 mg by mouth 3 (three) times daily as needed for mild constipation.     . Magnesium Oxide (MAG-200) 200 MG TABS Take 200 mg by mouth 2 (two) times daily.     . Multiple Vitamins-Minerals (MULTIVITAMIN WITH MINERALS) tablet Take 1 tablet by mouth in the morning and at bedtime.     . Multiple Vitamins-Minerals (ZINC PO) Take 1 tablet by mouth 2 (two) times daily.     . Omega-3 1000 MG CAPS Take 1,000 mg by mouth in the morning and at bedtime.     Marland Kitchen OVER THE Tanner MEDICATION Take 2 capsules by mouth daily. Melaleuca Peak Performance 50+     .  OVER THE Tanner MEDICATION Take 150 mg by mouth daily. Benfotiamine     . pyridOXINE (VITAMIN B-6) 100 MG tablet Take 100 mg by mouth in the morning and at bedtime.     Marland Kitchen QUERCETIN PO Take 500 mg by mouth in the morning and at bedtime.     . traMADol (ULTRAM) 50 MG tablet Take 2 tablets (100 mg total) by mouth 3 (three) times daily as needed. (Patient taking differently: Take 150 mg by mouth 3 (three) times daily as needed for severe pain.) 90 tablet 0   . traMADol (ULTRAM-ER) 100 MG 24 hr tablet Take 1 tablet (100 mg total) by mouth daily. 30 tablet 0   . vitamin B-12 (CYANOCOBALAMIN) 1000 MCG tablet Take 1,000 mcg by mouth in the  morning and at bedtime.     . vitamin C (ASCORBIC ACID) 500 MG tablet Take 500 mg by mouth in the morning and at bedtime.     . vitamin E 180 MG (400 UNITS) capsule Take 400 Units by mouth in the morning and at bedtime.     . metoprolol tartrate (LOPRESSOR) 25 MG tablet Take 1 tablet (25 mg total) by mouth 2 (two) times daily. 60 tablet 2    No Known Allergies  Social History   Tobacco Use  . Smoking status: Never Smoker  . Smokeless tobacco: Never Used  Substance Use Topics  . Alcohol use: Never    Family History  Problem Relation Age of Onset  . Heart disease Mother   . Arthritis Mother   . Osteoporosis Mother   . Hypothyroidism Mother   . Cancer Father   . Obesity Father   . Diabetes Father   . Lung cancer Father   . Healthy Sister   . Healthy Brother   . Diabetes Paternal Grandfather   . Healthy Sister      Review of Systems  Musculoskeletal: Positive for back pain and gait problem.  All other systems reviewed and are negative.   Objective:  Physical Exam Vitals reviewed.  Constitutional:      Appearance: Normal appearance.  HENT:     Head: Normocephalic and atraumatic.  Eyes:     Extraocular Movements: Extraocular movements intact.     Pupils: Pupils are equal, round, and reactive to light.  Cardiovascular:     Rate and Rhythm: Normal rate and regular rhythm.     Pulses: Normal pulses.  Pulmonary:     Effort: Pulmonary effort is normal.     Breath sounds: Normal breath sounds.  Abdominal:     Palpations: Abdomen is soft.  Musculoskeletal:     Cervical back: Normal range of motion and neck supple.     Right hip: Tenderness and bony tenderness present. Decreased range of motion. Decreased strength.  Neurological:     Mental Status: She is alert and oriented to person, place, and time.  Psychiatric:        Behavior: Behavior normal.     Vital signs in last 24 hours:    Labs:   Estimated body mass index is 38.96 kg/m as calculated from the  following:   Height as of 08/20/20: 5\' 4"  (1.626 m).   Weight as of 08/20/20: 103 kg.   Imaging Review Plain radiographs demonstrate severe degenerative joint disease of the right hip(s). The bone quality appears to be good for age and reported activity level.      Assessment/Plan:  End stage arthritis, right hip(s)  The patient history, physical examination,  clinical judgement of the provider and imaging studies are consistent with end stage degenerative joint disease of the right hip(s) and total hip arthroplasty is deemed medically necessary. The treatment options including medical management, injection therapy, arthroscopy and arthroplasty were discussed at length. The risks and benefits of total hip arthroplasty were presented and reviewed. The risks due to aseptic loosening, infection, stiffness, dislocation/subluxation,  thromboembolic complications and other imponderables were discussed.  The patient acknowledged the explanation, agreed to proceed with the plan and consent was signed. Patient is being admitted for inpatient treatment for surgery, pain control, PT, OT, prophylactic antibiotics, VTE prophylaxis, progressive ambulation and ADL's and discharge planning.The patient is planning to be discharged home with home health services

## 2020-08-28 ENCOUNTER — Encounter (HOSPITAL_COMMUNITY)
Admission: RE | Disposition: A | Discharge status: Home / Self Care | Payer: Self-pay | Source: Home / Self Care | Attending: Orthopaedic Surgery

## 2020-08-28 ENCOUNTER — Observation Stay (HOSPITAL_COMMUNITY)
Admission: RE | Admit: 2020-08-28 | Discharge: 2020-08-29 | Disposition: A | Discharge status: Home / Self Care | Payer: Self-pay | Attending: Orthopaedic Surgery | Admitting: Orthopaedic Surgery

## 2020-08-28 ENCOUNTER — Ambulatory Visit (HOSPITAL_COMMUNITY): Payer: Self-pay | Admitting: Anesthesiology

## 2020-08-28 ENCOUNTER — Ambulatory Visit (HOSPITAL_COMMUNITY): Payer: Self-pay

## 2020-08-28 ENCOUNTER — Encounter (HOSPITAL_COMMUNITY): Payer: Self-pay | Admitting: Orthopaedic Surgery

## 2020-08-28 ENCOUNTER — Ambulatory Visit (HOSPITAL_COMMUNITY): Payer: Self-pay | Admitting: Physician Assistant

## 2020-08-28 ENCOUNTER — Observation Stay (HOSPITAL_COMMUNITY): Payer: Self-pay

## 2020-08-28 DIAGNOSIS — Z96641 Presence of right artificial hip joint: Secondary | ICD-10-CM

## 2020-08-28 DIAGNOSIS — M1611 Unilateral primary osteoarthritis, right hip: Principal | ICD-10-CM | POA: Insufficient documentation

## 2020-08-28 DIAGNOSIS — S72041A Displaced fracture of base of neck of right femur, initial encounter for closed fracture: Secondary | ICD-10-CM

## 2020-08-28 HISTORY — PX: TOTAL HIP ARTHROPLASTY: SHX124

## 2020-08-28 SURGERY — ARTHROPLASTY, HIP, TOTAL, ANTERIOR APPROACH
Anesthesia: Spinal | Site: Hip | Laterality: Right

## 2020-08-28 MED ORDER — CEFAZOLIN SODIUM-DEXTROSE 1-4 GM/50ML-% IV SOLN
1.0000 g | Freq: Four times a day (QID) | INTRAVENOUS | Status: DC
  Filled 2020-08-28: qty 50

## 2020-08-28 MED ORDER — SODIUM CHLORIDE 0.9 % IV SOLN: INTRAVENOUS | Status: DC

## 2020-08-28 MED ORDER — ORAL CARE MOUTH RINSE: 15.0000 mL | Freq: Once | OROMUCOSAL | Status: AC

## 2020-08-28 MED ORDER — ONDANSETRON HCL 4 MG/2ML IJ SOLN
4.0000 mg | Freq: Four times a day (QID) | INTRAMUSCULAR | Status: DC | PRN
  Administered 2020-08-28: 4 mg via INTRAVENOUS
  Filled 2020-08-28: qty 2

## 2020-08-28 MED ORDER — PROPOFOL 500 MG/50ML IV EMUL
INTRAVENOUS | Status: DC | PRN
  Administered 2020-08-28: 100 ug/kg/min via INTRAVENOUS

## 2020-08-28 MED ORDER — EPHEDRINE 5 MG/ML INJ
INTRAVENOUS | Status: AC
  Filled 2020-08-28: qty 10

## 2020-08-28 MED ORDER — METOPROLOL TARTRATE 25 MG PO TABS
25.0000 mg | ORAL_TABLET | Freq: Two times a day (BID) | ORAL | Status: DC
  Filled 2020-08-28 (×2): qty 1

## 2020-08-28 MED ORDER — EPHEDRINE SULFATE-NACL 50-0.9 MG/10ML-% IV SOSY
PREFILLED_SYRINGE | INTRAVENOUS | Status: DC | PRN
  Administered 2020-08-28: 5 mg via INTRAVENOUS
  Administered 2020-08-28: 10 mg via INTRAVENOUS

## 2020-08-28 MED ORDER — ASPIRIN 81 MG PO CHEW: 81.0000 mg | CHEWABLE_TABLET | Freq: Two times a day (BID) | ORAL | 0 refills | Status: AC

## 2020-08-28 MED ORDER — 0.9 % SODIUM CHLORIDE (POUR BTL) OPTIME
TOPICAL | Status: DC | PRN
  Administered 2020-08-28: 1000 mL

## 2020-08-28 MED ORDER — ALUM & MAG HYDROXIDE-SIMETH 200-200-20 MG/5ML PO SUSP: 30.0000 mL | ORAL | Status: DC | PRN

## 2020-08-28 MED ORDER — OXYCODONE HCL 5 MG PO TABS
10.0000 mg | ORAL_TABLET | ORAL | Status: DC | PRN
  Administered 2020-08-28: 10 mg via ORAL
  Filled 2020-08-28 (×3): qty 2

## 2020-08-28 MED ORDER — METOCLOPRAMIDE HCL 5 MG/ML IJ SOLN
5.0000 mg | Freq: Three times a day (TID) | INTRAMUSCULAR | Status: DC | PRN
  Filled 2020-08-28: qty 2

## 2020-08-28 MED ORDER — ONDANSETRON HCL 4 MG/2ML IJ SOLN
INTRAMUSCULAR | Status: DC | PRN
  Administered 2020-08-28: 4 mg via INTRAVENOUS

## 2020-08-28 MED ORDER — BENFOTIAMINE 150 MG PO CAPS: 150.0000 mg | ORAL_CAPSULE | Freq: Every day | ORAL | Status: DC

## 2020-08-28 MED ORDER — CHLORHEXIDINE GLUCONATE 0.12 % MT SOLN
15.0000 mL | Freq: Once | OROMUCOSAL | Status: AC
  Administered 2020-08-28: 15 mL via OROMUCOSAL

## 2020-08-28 MED ORDER — PROPOFOL 1000 MG/100ML IV EMUL
INTRAVENOUS | Status: AC
  Filled 2020-08-28: qty 100

## 2020-08-28 MED ORDER — DOCUSATE SODIUM 100 MG PO CAPS
100.0000 mg | ORAL_CAPSULE | Freq: Two times a day (BID) | ORAL | Status: DC
  Filled 2020-08-28 (×2): qty 1

## 2020-08-28 MED ORDER — METHOCARBAMOL 1000 MG/10ML IJ SOLN
500.0000 mg | Freq: Four times a day (QID) | INTRAVENOUS | Status: DC | PRN
  Filled 2020-08-28: qty 5

## 2020-08-28 MED ORDER — FENTANYL CITRATE (PF) 100 MCG/2ML IJ SOLN
INTRAMUSCULAR | Status: AC
  Filled 2020-08-28: qty 2

## 2020-08-28 MED ORDER — MIDAZOLAM HCL 2 MG/2ML IJ SOLN
INTRAMUSCULAR | Status: AC
  Filled 2020-08-28: qty 2

## 2020-08-28 MED ORDER — PHENYLEPHRINE HCL-NACL 10-0.9 MG/250ML-% IV SOLN
INTRAVENOUS | Status: DC | PRN
  Administered 2020-08-28: 25 ug/min via INTRAVENOUS

## 2020-08-28 MED ORDER — FENTANYL CITRATE (PF) 100 MCG/2ML IJ SOLN
INTRAMUSCULAR | Status: DC | PRN
  Administered 2020-08-28 (×2): 50 ug via INTRAVENOUS

## 2020-08-28 MED ORDER — POLYETHYLENE GLYCOL 3350 17 G PO PACK: 17.0000 g | PACK | Freq: Every day | ORAL | Status: DC | PRN

## 2020-08-28 MED ORDER — METHOCARBAMOL 500 MG PO TABS
500.0000 mg | ORAL_TABLET | Freq: Four times a day (QID) | ORAL | Status: DC | PRN
  Administered 2020-08-28 – 2020-08-29 (×3): 500 mg via ORAL
  Filled 2020-08-28 (×3): qty 1

## 2020-08-28 MED ORDER — METHOCARBAMOL 500 MG PO TABS: 500.0000 mg | ORAL_TABLET | Freq: Four times a day (QID) | ORAL | 0 refills | Status: AC | PRN

## 2020-08-28 MED ORDER — HYDROMORPHONE HCL 1 MG/ML IJ SOLN: 0.5000 mg | INTRAMUSCULAR | Status: DC | PRN

## 2020-08-28 MED ORDER — ONDANSETRON HCL 4 MG/2ML IJ SOLN
INTRAMUSCULAR | Status: AC
  Filled 2020-08-28: qty 2

## 2020-08-28 MED ORDER — HYDROMORPHONE HCL 1 MG/ML IJ SOLN
0.2500 mg | INTRAMUSCULAR | Status: DC | PRN
  Administered 2020-08-28 (×4): 0.5 mg via INTRAVENOUS

## 2020-08-28 MED ORDER — HYDROMORPHONE HCL 1 MG/ML IJ SOLN
INTRAMUSCULAR | Status: AC
  Filled 2020-08-28: qty 1

## 2020-08-28 MED ORDER — TRANEXAMIC ACID-NACL 1000-0.7 MG/100ML-% IV SOLN
1000.0000 mg | INTRAVENOUS | Status: AC
  Administered 2020-08-28: 1000 mg via INTRAVENOUS
  Filled 2020-08-28: qty 100

## 2020-08-28 MED ORDER — CEFAZOLIN SODIUM-DEXTROSE 2-4 GM/100ML-% IV SOLN
2.0000 g | INTRAVENOUS | Status: AC
  Administered 2020-08-28: 2 g via INTRAVENOUS
  Filled 2020-08-28: qty 100

## 2020-08-28 MED ORDER — PROPOFOL 10 MG/ML IV BOLUS
INTRAVENOUS | Status: DC | PRN
  Administered 2020-08-28 (×2): 30 mg via INTRAVENOUS

## 2020-08-28 MED ORDER — PHENOL 1.4 % MT LIQD: 1.0000 | OROMUCOSAL | Status: DC | PRN

## 2020-08-28 MED ORDER — SODIUM CHLORIDE 0.9 % IR SOLN
Status: DC | PRN
Start: 2020-08-28 — End: 2020-08-28
  Administered 2020-08-28: 1000 mL

## 2020-08-28 MED ORDER — OXYCODONE HCL 5 MG/5ML PO SOLN: 5.0000 mg | Freq: Once | ORAL | Status: AC | PRN

## 2020-08-28 MED ORDER — OXYCODONE HCL 5 MG PO TABS: 5.0000 mg | ORAL_TABLET | Freq: Four times a day (QID) | ORAL | 0 refills | Status: DC | PRN

## 2020-08-28 MED ORDER — PANTOPRAZOLE SODIUM 40 MG PO TBEC
40.0000 mg | DELAYED_RELEASE_TABLET | Freq: Every day | ORAL | Status: DC
  Filled 2020-08-28: qty 1

## 2020-08-28 MED ORDER — DROPERIDOL 2.5 MG/ML IJ SOLN: 0.6250 mg | Freq: Once | INTRAMUSCULAR | Status: DC | PRN

## 2020-08-28 MED ORDER — MENTHOL 3 MG MT LOZG: 1.0000 | LOZENGE | OROMUCOSAL | Status: DC | PRN

## 2020-08-28 MED ORDER — OXYCODONE HCL 5 MG PO TABS
5.0000 mg | ORAL_TABLET | ORAL | Status: DC | PRN
  Administered 2020-08-28 – 2020-08-29 (×3): 10 mg via ORAL
  Administered 2020-08-29 (×4): 5 mg via ORAL
  Filled 2020-08-28 (×2): qty 1
  Filled 2020-08-28 (×2): qty 2
  Filled 2020-08-28: qty 1

## 2020-08-28 MED ORDER — POVIDONE-IODINE 10 % EX SWAB
2.0000 "application " | Freq: Once | CUTANEOUS | Status: AC
  Administered 2020-08-28: 2 via TOPICAL

## 2020-08-28 MED ORDER — DIPHENHYDRAMINE HCL 12.5 MG/5ML PO ELIX
12.5000 mg | ORAL_SOLUTION | ORAL | Status: DC | PRN
Start: 2020-08-28 — End: 2020-08-29

## 2020-08-28 MED ORDER — ONDANSETRON HCL 4 MG PO TABS: 4.0000 mg | ORAL_TABLET | Freq: Four times a day (QID) | ORAL | Status: DC | PRN

## 2020-08-28 MED ORDER — MIDAZOLAM HCL 5 MG/5ML IJ SOLN
INTRAMUSCULAR | Status: DC | PRN
  Administered 2020-08-28 (×2): 1 mg via INTRAVENOUS

## 2020-08-28 MED ORDER — METOCLOPRAMIDE HCL 5 MG PO TABS: 5.0000 mg | ORAL_TABLET | Freq: Three times a day (TID) | ORAL | Status: DC | PRN

## 2020-08-28 MED ORDER — ACETAMINOPHEN 10 MG/ML IV SOLN: 1000.0000 mg | Freq: Once | INTRAVENOUS | Status: DC | PRN

## 2020-08-28 MED ORDER — LACTATED RINGERS IV SOLN: INTRAVENOUS | Status: DC

## 2020-08-28 MED ORDER — ONDANSETRON HCL 4 MG/2ML IJ SOLN: 4.0000 mg | Freq: Once | INTRAMUSCULAR | Status: DC | PRN

## 2020-08-28 MED ORDER — ACETAMINOPHEN 325 MG PO TABS: 325.0000 mg | ORAL_TABLET | Freq: Four times a day (QID) | ORAL | Status: DC | PRN

## 2020-08-28 MED ORDER — PROPOFOL 10 MG/ML IV BOLUS
INTRAVENOUS | Status: AC
  Filled 2020-08-28: qty 20

## 2020-08-28 MED ORDER — OXYCODONE HCL 5 MG PO TABS
5.0000 mg | ORAL_TABLET | Freq: Once | ORAL | Status: AC | PRN
  Administered 2020-08-28: 5 mg via ORAL

## 2020-08-28 MED ORDER — ASPIRIN 81 MG PO CHEW
81.0000 mg | CHEWABLE_TABLET | Freq: Two times a day (BID) | ORAL | Status: DC
  Administered 2020-08-28 – 2020-08-29 (×2): 81 mg via ORAL
  Filled 2020-08-28 (×2): qty 1

## 2020-08-28 MED ORDER — BUPIVACAINE IN DEXTROSE 0.75-8.25 % IT SOLN
INTRATHECAL | Status: DC | PRN
  Administered 2020-08-28: 12 mg via INTRATHECAL

## 2020-08-28 MED ORDER — OXYCODONE HCL 5 MG PO TABS
ORAL_TABLET | ORAL | Status: AC
  Filled 2020-08-28: qty 1

## 2020-08-28 MED ORDER — SODIUM CHLORIDE 0.9 % IV SOLN
1.0000 g | Freq: Four times a day (QID) | INTRAVENOUS | Status: AC
  Administered 2020-08-28 (×2): 1 g via INTRAVENOUS
  Filled 2020-08-28 (×2): qty 1

## 2020-08-28 SURGICAL SUPPLY — 38 items
BAG ZIPLOCK 12X15 (MISCELLANEOUS) IMPLANT
BENZOIN TINCTURE PRP APPL 2/3 (GAUZE/BANDAGES/DRESSINGS) IMPLANT
BLADE SAW SGTL 18X1.27X75 (BLADE) ×2 IMPLANT
COVER PERINEAL POST (MISCELLANEOUS) ×2 IMPLANT
COVER SURGICAL LIGHT HANDLE (MISCELLANEOUS) ×2 IMPLANT
COVER WAND RF STERILE (DRAPES) ×2 IMPLANT
DRAPE STERI IOBAN 125X83 (DRAPES) ×2 IMPLANT
DRAPE U-SHAPE 47X51 STRL (DRAPES) ×4 IMPLANT
DRSG AQUACEL AG ADV 3.5X10 (GAUZE/BANDAGES/DRESSINGS) ×2 IMPLANT
DURAPREP 26ML APPLICATOR (WOUND CARE) ×2 IMPLANT
ELECT REM PT RETURN 15FT ADLT (MISCELLANEOUS) ×2 IMPLANT
GAUZE XEROFORM 1X8 LF (GAUZE/BANDAGES/DRESSINGS) ×2 IMPLANT
GLOVE SRG 8 PF TXTR STRL LF DI (GLOVE) ×2 IMPLANT
GLOVE SURG ENC MOIS LTX SZ7.5 (GLOVE) ×2 IMPLANT
GLOVE SURG LTX SZ8 (GLOVE) ×2 IMPLANT
GLOVE SURG UNDER POLY LF SZ8 (GLOVE) ×4
GOWN STRL REUS W/TWL XL LVL3 (GOWN DISPOSABLE) ×4 IMPLANT
HANDPIECE INTERPULSE COAX TIP (DISPOSABLE) ×2
HEAD CERAMIC DELTA 36 PLUS 1.5 (Hips) ×2 IMPLANT
HOLDER FOLEY CATH W/STRAP (MISCELLANEOUS) ×2 IMPLANT
KIT TURNOVER KIT A (KITS) ×2 IMPLANT
LINER ACETAB NEUTRAL 36ID 520D (Liner) ×2 IMPLANT
PACK ANTERIOR HIP CUSTOM (KITS) ×2 IMPLANT
PENCIL SMOKE EVACUATOR (MISCELLANEOUS) IMPLANT
PIN SECTOR W/GRIP ACE CUP 52MM (Hips) ×2 IMPLANT
SCREW 6.5MMX25MM (Screw) ×2 IMPLANT
SET HNDPC FAN SPRY TIP SCT (DISPOSABLE) ×1 IMPLANT
STAPLER VISISTAT 35W (STAPLE) IMPLANT
STEM CORAIL KA13 (Stem) ×2 IMPLANT
STRIP CLOSURE SKIN 1/2X4 (GAUZE/BANDAGES/DRESSINGS) IMPLANT
SUT ETHIBOND NAB CT1 #1 30IN (SUTURE) ×2 IMPLANT
SUT ETHILON 2 0 PS N (SUTURE) IMPLANT
SUT MNCRL AB 4-0 PS2 18 (SUTURE) IMPLANT
SUT VIC AB 0 CT1 36 (SUTURE) ×2 IMPLANT
SUT VIC AB 1 CT1 36 (SUTURE) ×2 IMPLANT
SUT VIC AB 2-0 CT1 27 (SUTURE) ×4
SUT VIC AB 2-0 CT1 TAPERPNT 27 (SUTURE) ×2 IMPLANT
TRAY FOLEY MTR SLVR 16FR STAT (SET/KITS/TRAYS/PACK) IMPLANT

## 2020-08-28 NOTE — Progress Notes (Signed)
PHARMACIST - PHYSICIAN ORDER COMMUNICATION  CONCERNING: P&T Medication Policy on Herbal Medications  DESCRIPTION:  This patient's order for:  Benfotiamine  has been noted.  This product(s) is classified as an "herbal" or natural product. Due to a lack of definitive safety studies or FDA approval, nonstandard manufacturing practices, plus the potential risk of unknown drug-drug interactions while on inpatient medications, the Pharmacy and Therapeutics Committee does not permit the use of "herbal" or natural products of this type within [facility].   ACTION TAKEN: The pharmacy department is unable to verify this order at this time and your patient has been informed of this safety policy. Please reevaluate patient's clinical condition at discharge and address if the herbal or natural product(s) should be resumed at that time.  Otho Bellows PharmD Phone [phone] 08/28/2020, 2:31 PM

## 2020-08-28 NOTE — Evaluation (Signed)
Physical Therapy Evaluation Patient Details Name: Jean Tanner MRN: [id number] DOB: 1960/06/16 Today's Date: 08/28/2020   History of Present Illness  Pt s/p R THR  Clinical Impression  Pt s/p R THR and presents with decreased R LE strength/ROM and post op pain limiting functional mobility.  Pt should progress to dc home with family assist and HHPT follow up.    Follow Up Recommendations Home health PT    Equipment Recommendations  None recommended by PT    Recommendations for Other Services       Precautions / Restrictions Precautions Precautions: Fall Restrictions Weight Bearing Restrictions: No Other Position/Activity Restrictions: WBAT      Mobility  Bed Mobility Overal bed mobility: Needs Assistance Bed Mobility: Supine to Sit     Supine to sit: Min assist     General bed mobility comments: Cues for sequence and use of L LE to self assist.  Physical assist to manage R LE and to bring trunk to upright    Transfers Overall transfer level: Needs assistance Equipment used: Rolling walker (2 wheeled) Transfers: Sit to/from Stand Sit to Stand: Min assist         General transfer comment: cues for LE management and use of UEs to self assist  Ambulation/Gait Ambulation/Gait assistance: Min assist Gait Distance (Feet): 10 Feet Assistive device: Rolling walker (2 wheeled) Gait Pattern/deviations: Step-to pattern;Decreased step length - right;Decreased step length - left;Shuffle;Trunk flexed Gait velocity: decr   General Gait Details: cues for sequence, posture and position from RW - distance limited by nausea  Stairs            Wheelchair Mobility    Modified Rankin (Stroke Patients Only)       Balance Overall balance assessment: Needs assistance Sitting-balance support: No upper extremity supported;Feet supported Sitting balance-Leahy Scale: Good     Standing balance support: Bilateral upper extremity supported Standing balance-Leahy  Scale: Poor                               Pertinent Vitals/Pain Pain Assessment: 0-10 Pain Score: 3  Pain Location: R hip Pain Descriptors / Indicators: Aching;Sore Pain Intervention(s): Limited activity within patient's tolerance;Monitored during session;Premedicated before session;Ice applied    Home Living Family/patient expects to be discharged to:: Private residence Living Arrangements: Spouse/significant other Available Help at Discharge: Family Type of Home: House Home Access: Ramped entrance     Home Layout: One level Home Equipment: Environmental consultant - 2 wheels;Cane - single point;Crutches;Bedside commode;Shower seat      Prior Function Level of Independence: Independent with assistive device(s)         Comments: use of cane and RW as needed     Hand Dominance        Extremity/Trunk Assessment   Upper Extremity Assessment Upper Extremity Assessment: Overall WFL for tasks assessed    Lower Extremity Assessment Lower Extremity Assessment: RLE deficits/detail    Cervical / Trunk Assessment Cervical / Trunk Assessment: Normal  Communication   Communication: No difficulties  Cognition Arousal/Alertness: Awake/alert Behavior During Therapy: WFL for tasks assessed/performed Overall Cognitive Status: Within Functional Limits for tasks assessed                                        General Comments      Exercises Total Joint Exercises Ankle Circles/Pumps: AROM;Both;15 reps;Supine  Assessment/Plan    PT Assessment Patient needs continued PT services  PT Problem List Decreased strength;Decreased range of motion;Decreased activity tolerance;Decreased balance;Decreased mobility;Decreased knowledge of use of DME;Pain       PT Treatment Interventions DME instruction;Gait training;Stair training;Functional mobility training;Therapeutic activities;Therapeutic exercise;Patient/family education    PT Goals (Current goals can be found in  the Care Plan section)  Acute Rehab PT Goals Patient Stated Goal: Regain IND PT Goal Formulation: With patient Time For Goal Achievement: 09/04/20 Potential to Achieve Goals: Good    Frequency 7X/week   Barriers to discharge        Co-evaluation               AM-PAC PT "6 Clicks" Mobility  Outcome Measure Help needed turning from your back to your side while in a flat bed without using bedrails?: A Little Help needed moving from lying on your back to sitting on the side of a flat bed without using bedrails?: A Little Help needed moving to and from a bed to a chair (including a wheelchair)?: A Little Help needed standing up from a chair using your arms (e.g., wheelchair or bedside chair)?: A Little Help needed to walk in hospital room?: A Little Help needed climbing 3-5 steps with a railing? : A Lot 6 Click Score: 17    End of Session Equipment Utilized During Treatment: Gait belt Activity Tolerance: Patient tolerated treatment well;Other (comment) (nausea) Patient left: in chair;with call bell/phone within reach;with nursing/sitter in room;with family/visitor present Nurse Communication: Mobility status PT Visit Diagnosis: Difficulty in walking, not elsewhere classified (R26.2)    Time: 0814-4818 PT Time Calculation (min) (ACUTE ONLY): 22 min   Charges:   PT Evaluation $PT Eval Low Complexity: 1 Low          Mauro Kaufmann PT Acute Rehabilitation Services Pager [phone] Office [phone]   Mathis Cashman 08/28/2020, 4:28 PM

## 2020-08-28 NOTE — Anesthesia Procedure Notes (Signed)
Spinal  Patient location during procedure: OR Start time: 08/28/2020 10:07 AM End time: 08/28/2020 10:22 AM Reason for block: surgical anesthesia Staffing Anesthesiologist: Trevor Iha, MD Preanesthetic Checklist Completed: patient identified, IV checked, site marked, risks and benefits discussed, surgical consent, monitors and equipment checked, pre-op evaluation and timeout performed Spinal Block Patient position: sitting Prep: DuraPrep Patient monitoring: heart rate, cardiac monitor, continuous pulse ox and blood pressure Approach: midline Location: L3-4 Injection technique: single-shot Needle Needle type: Spinocan  Needle gauge: 25 G Needle length: 9 cm Needle insertion depth: 9 cm Assessment Sensory level: T4 Events: CSF return and second provider Additional Notes 1 attempt at L2/3, 2 atempts at L 3-4 w Pencan uncuscessful. Final attempt at L4-5 w CSE (as introducer)

## 2020-08-28 NOTE — Anesthesia Postprocedure Evaluation (Signed)
Anesthesia Post Note  Patient: Jean Tanner  Procedure(s) Performed: RIGHT TOTAL HIP ARTHROPLASTY ANTERIOR APPROACH (Right Hip)     Patient location during evaluation: Nursing Unit Anesthesia Type: Spinal Level of consciousness: oriented and awake and alert Pain management: pain level controlled Vital Signs Assessment: post-procedure vital signs reviewed and stable Respiratory status: spontaneous breathing and respiratory function stable Cardiovascular status: blood pressure returned to baseline and stable Postop Assessment: no headache, no backache, no apparent nausea or vomiting and patient able to bend at knees Anesthetic complications: no   No complications documented.  Last Vitals:  Vitals:   08/28/20 1215 08/28/20 1230  BP:    Pulse:    Temp: (!) 35.8 C (!) 36.3 C  SpO2:      Last Pain:  Vitals:   08/28/20 1315  TempSrc:   PainSc: 3                  Trevor Iha

## 2020-08-28 NOTE — Transfer of Care (Signed)
Immediate Anesthesia Transfer of Care Note  Patient: Jean Tanner  Procedure(s) Performed: RIGHT TOTAL HIP ARTHROPLASTY ANTERIOR APPROACH (Right Hip)  Patient Location: PACU  Anesthesia Type:Spinal  Level of Consciousness: awake, alert  and oriented  Airway & Oxygen Therapy: Patient Spontanous Breathing, Patient connected to face mask oxygen and Patient connected to face mask  Post-op Assessment: Report given to RN and Post -op Vital signs reviewed and stable  Post vital signs: Reviewed and stable  Last Vitals:  Vitals Value Taken Time  BP    Temp    Pulse    Resp    SpO2      Last Pain:  Vitals:   08/28/20 0815  TempSrc:   PainSc: 0-No pain      Patients Stated Pain Goal: 4 (08/28/20 0815)  Complications: No complications documented.

## 2020-08-28 NOTE — Interval H&P Note (Signed)
History and Physical Interval Note: The patient understands that she is here today for right total hip replacement to treat her right hip osteoarthritis.  There has been no acute or interval change in her medical status.  See recent H&P.  The risks and benefits of surgery been explained in detail and informed consent is obtained.  The right hip has been marked.  08/28/2020 8:29 AM  Jean Tanner  has presented today for surgery, with the diagnosis of osteoarthritis right hip.  The various methods of treatment have been discussed with the patient and family. After consideration of risks, benefits and other options for treatment, the patient has consented to  Procedure(s): RIGHT TOTAL HIP ARTHROPLASTY ANTERIOR APPROACH (Right) as a surgical intervention.  The patient's history has been reviewed, patient examined, no change in status, stable for surgery.  I have reviewed the patient's chart and labs.  Questions were answered to the patient's satisfaction.     Kathryne Hitch

## 2020-08-28 NOTE — Brief Op Note (Signed)
08/28/2020  11:40 AM  PATIENT:  Doristine Counter  60 y.o. female  PRE-OPERATIVE DIAGNOSIS:  osteoarthritis right hip  POST-OPERATIVE DIAGNOSIS:  osteoarthritis right hip  PROCEDURE:  Procedure(s): RIGHT TOTAL HIP ARTHROPLASTY ANTERIOR APPROACH (Right)  SURGEON:  Surgeon(s) and Role:    Kathryne Hitch, MD - Primary  PHYSICIAN ASSISTANT:  Rexene Edison, PA-C   ANESTHESIA:   spinal  EBL:  550 mL   COUNTS:  YES  DICTATION: .Other Dictation: Dictation Number [id number]  PLAN OF CARE: Admit for overnight observation  PATIENT DISPOSITION:  PACU - hemodynamically stable.   Delay start of Pharmacological VTE agent (>24hrs) due to surgical blood loss or risk of bleeding: no

## 2020-08-29 ENCOUNTER — Other Ambulatory Visit: Payer: Self-pay

## 2020-08-29 LAB — BASIC METABOLIC PANEL
Anion gap: 4 — ABNORMAL LOW (ref 5–15)
BUN: 11 mg/dL (ref 6–20)
CO2: 32 mmol/L (ref 22–32)
Calcium: 9.2 mg/dL (ref 8.9–10.3)
Chloride: 102 mmol/L (ref 98–111)
Creatinine, Ser: 0.5 mg/dL (ref 0.44–1.00)
GFR, Estimated: >60 mL/min (ref >60)
Glucose, Bld: 119 mg/dL — ABNORMAL HIGH (ref 70–99)
Potassium: 3.8 mmol/L (ref 3.5–5.1)
Sodium: 138 mmol/L (ref 135–145)

## 2020-08-29 LAB — CBC
HCT: 36 % (ref 36.0–46.0)
Hemoglobin: 11.8 g/dL — ABNORMAL LOW (ref 12.0–15.0)
MCH: 32.1 pg (ref 26.0–34.0)
MCHC: 32.8 g/dL (ref 30.0–36.0)
MCV: 97.8 fL (ref 80.0–100.0)
Platelets: 207 10*3/uL (ref 150–400)
RBC: 3.68 MIL/uL — ABNORMAL LOW (ref 3.87–5.11)
RDW: 13.2 % (ref 11.5–15.5)
WBC: 7.5 10*3/uL (ref 4.0–10.5)
nRBC: 0 % (ref 0.0–0.2)

## 2020-08-29 NOTE — Progress Notes (Signed)
The patient is alert and oriented and has been seen by her physician. The orders for discharge were written. IV has been removed. Went over discharge instructions with patient and family. She is being discharged via wheelchair with all of her belongings.  

## 2020-08-29 NOTE — Discharge Summary (Signed)
Patient ID: Jean Tanner MRN: [id number] DOB/AGE: October 09, 1960 60 y.o.  Admit date: 08/28/2020 Discharge date: 08/29/2020  Admission Diagnoses:  Principal Problem:   Unilateral primary osteoarthritis, right hip Active Problems:   Status post total replacement of right hip   Discharge Diagnoses:  Same  Past Medical History:  Diagnosis Date  . Arthritis   . GERD (gastroesophageal reflux disease)    hx of    Surgeries: Procedure(s): RIGHT TOTAL HIP ARTHROPLASTY ANTERIOR APPROACH on 08/28/2020   Consultants:   Discharged Condition: Improved  Hospital Course: Jean Tanner is an 60 y.o. female who was admitted 08/28/2020 for operative treatment ofUnilateral primary osteoarthritis, right hip. Patient has severe unremitting pain that affects sleep, daily activities, and work/hobbies. After pre-op clearance the patient was taken to the operating room on 08/28/2020 and underwent  Procedure(s): RIGHT TOTAL HIP ARTHROPLASTY ANTERIOR APPROACH.    Patient was given perioperative antibiotics:  Anti-infectives (From admission, onward)   Start     Dose/Rate Route Frequency Ordered Stop   08/28/20 1700  ceFAZolin (ANCEF) IVPB 1 g/50 mL premix  Status:  Discontinued        1 g 100 mL/hr over 30 Minutes Intravenous Every 6 hours 08/28/20 1419 08/28/20 1436   08/28/20 1700  ceFAZolin (ANCEF) 1 g in sodium chloride 0.9 % 100 mL IVPB        1 g 200 mL/hr over 30 Minutes Intravenous Every 6 hours 08/28/20 1436 08/28/20 2208   08/28/20 0800  ceFAZolin (ANCEF) IVPB 2g/100 mL premix        2 g 200 mL/hr over 30 Minutes Intravenous On call to O.R. 08/28/20 0747 08/28/20 1028       Patient was given sequential compression devices, early ambulation, and chemoprophylaxis to prevent DVT.  Patient benefited maximally from hospital stay and there were no complications.    Recent vital signs:  Patient Vitals for the past 24 hrs:  BP Temp Temp src Pulse Resp SpO2  08/29/20 1009 111/76 98.1 F  (36.7 C) Oral 67 20 100 %  08/29/20 0534 115/70 98.9 F (37.2 C) Oral 70 16 99 %  08/29/20 0150 109/80 99.4 F (37.4 C) Oral 64 16 100 %  08/28/20 2210 123/78 98.2 F (36.8 C) Oral 62 16 100 %  08/28/20 1723 118/68 -- -- 60 18 97 %  08/28/20 1315 (!) 151/79 (!) 97.4 F (36.3 C) -- (!) 55 12 96 %  08/28/20 1300 138/86 -- -- (!) 53 12 98 %  08/28/20 1245 125/87 -- -- (!) 52 13 100 %  08/28/20 1230 137/80 (!) 97.4 F (36.3 C) -- (!) 58 15 100 %  08/28/20 1215 121/72 (!) 96.5 F (35.8 C) -- (!) 51 14 100 %     Recent laboratory studies:  Recent Labs    08/29/20 0310  WBC 7.5  HGB 11.8*  HCT 36.0  PLT 207  NA 138  K 3.8  CL 102  CO2 32  BUN 11  CREATININE 0.50  GLUCOSE 119*  CALCIUM 9.2     Discharge Medications:   Allergies as of 08/29/2020   No Known Allergies     Medication List    TAKE these medications   acetaminophen 650 MG CR tablet Commonly known as: TYLENOL Take 1,300 mg by mouth every 8 (eight) hours as needed for pain.   aspirin 81 MG chewable tablet Chew 1 tablet (81 mg total) by mouth 2 (two) times daily.   B COMPLEX PO Take 1 tablet  by mouth 2 (two) times daily.   baclofen 10 MG tablet Commonly known as: LIORESAL Take 0.5-1 tablets (5-10 mg total) by mouth 3 (three) times daily as needed for muscle spasms. What changed: how much to take   Benfotiamine 150 MG Caps Take 150 mg by mouth daily.   Biotin 1000 MCG tablet Take 1,000 mcg by mouth in the morning and at bedtime.   CALCIUM-MAGNESIUM-VITAMIN D PO Take 1 tablet by mouth in the morning and at bedtime.   cholecalciferol 25 MCG (1000 UNIT) tablet Commonly known as: VITAMIN D3 Take 1,000 Units by mouth in the morning and at bedtime.   COLLAGEN-VITAMIN C-BIOTIN PO Take 3 capsules by mouth daily.   diclofenac 75 MG EC tablet Commonly known as: VOLTAREN Take 75 mg by mouth 2 (two) times daily.   docusate sodium 100 MG capsule Commonly known as: COLACE Take 100 mg by mouth 3  (three) times daily as needed for mild constipation.   Mag-200 200 MG Tabs Generic drug: Magnesium Oxide Take 200 mg by mouth 2 (two) times daily.   methocarbamol 500 MG tablet Commonly known as: ROBAXIN Take 1 tablet (500 mg total) by mouth every 6 (six) hours as needed for muscle spasms.   metoprolol tartrate 25 MG tablet Commonly known as: LOPRESSOR Take 1 tablet (25 mg total) by mouth 2 (two) times daily.   multivitamin with minerals tablet Take 1 tablet by mouth in the morning and at bedtime.   Omega-3 1000 MG Caps Take 1,000 mg by mouth in the morning and at bedtime.   OVER THE COUNTER MEDICATION Take 2 capsules by mouth daily. Melaleuca Peak Performance 50+   OVER THE COUNTER MEDICATION Take 150 mg by mouth daily. Benfotiamine   oxyCODONE 5 MG immediate release tablet Commonly known as: Oxy IR/ROXICODONE Take 1-2 tablets (5-10 mg total) by mouth every 6 (six) hours as needed for moderate pain (pain score 4-6).   pyridOXINE 100 MG tablet Commonly known as: VITAMIN B-6 Take 100 mg by mouth in the morning and at bedtime.   QUERCETIN PO Take 500 mg by mouth in the morning and at bedtime.   traMADol 100 MG 24 hr tablet Commonly known as: ULTRAM-ER Take 1 tablet (100 mg total) by mouth daily. What changed: Another medication with the same name was changed. Make sure you understand how and when to take each.   traMADol 50 MG tablet Commonly known as: ULTRAM Take 2 tablets (100 mg total) by mouth 3 (three) times daily as needed. What changed:   how much to take  reasons to take this   vitamin B-12 1000 MCG tablet Commonly known as: CYANOCOBALAMIN Take 1,000 mcg by mouth in the morning and at bedtime.   vitamin C 500 MG tablet Commonly known as: ASCORBIC ACID Take 500 mg by mouth in the morning and at bedtime.   vitamin E 180 MG (400 UNITS) capsule Take 400 Units by mouth in the morning and at bedtime.   ZINC PO Take 1 tablet by mouth 2 (two) times  daily.            Durable Medical Equipment  (From admission, onward)         Start     Ordered   08/28/20 1420  DME 3 n 1  Once        08/28/20 1419   08/28/20 1420  DME Walker rolling  Once       Question Answer Comment  Walker: With 5 Inch  Wheels   Patient needs a walker to treat with the following condition Status post total replacement of right hip      08/28/20 1419          Diagnostic Studies: DG Pelvis Portable  Result Date: 08/28/2020 CLINICAL DATA:  Status post total replacement of the RIGHT hip. EXAM: PORTABLE PELVIS 1-2 VIEWS COMPARISON:  08/28/2020 FINDINGS: Patient has undergone total RIGHT hip arthroplasty. The hardware appears well seated. There is no evidence for dislocation on the frontal view. No acute fracture. Degenerative changes are noted in the LOWER lumbar spine. IMPRESSION: RIGHT total hip arthroplasty.  No adverse features. Electronically Signed   By: Norva Pavlov M.D.   On: 08/28/2020 13:14   DG C-Arm 1-60 Min-No Report  Result Date: 08/28/2020 Fluoroscopy was utilized by the requesting physician.  No radiographic interpretation.   DG HIP OPERATIVE UNILAT W OR W/O PELVIS RIGHT  Result Date: 08/28/2020 CLINICAL DATA:  Right hip replacement EXAM: OPERATIVE RIGHT HIP (WITH PELVIS IF PERFORMED) 3 VIEWS TECHNIQUE: Fluoroscopic spot image(s) were submitted for interpretation post-operatively. COMPARISON:  06/11/2020 FINDINGS: Changes of right hip replacement. Normal alignment. No hardware bony complicating feature. IMPRESSION: Right hip replacement.  No visible complicating feature. Electronically Signed   By: Charlett Nose M.D.   On: 08/28/2020 11:53    Disposition: Discharge disposition: 01-Home or Self Care          Follow-up Information    Kathryne Hitch, MD Follow up in 2 week(s).   Specialty: Orthopedic Surgery Contact information: [address] [place] [address] [phone]                 Signed: Kathryne Hitch 08/29/2020, 11:05 AM

## 2020-08-29 NOTE — Op Note (Signed)
Jean Tanner, TSOU MEDICAL RECORD NO: [id number] ACCOUNT NO: [id number] DATE OF BIRTH: 05-15-1960 FACILITY: [facility] LOCATION: [id number] PHYSICIAN: Vanita Panda. Magnus Ivan, MD  Operative Report   DATE OF PROCEDURE: 08/28/2020   PREOPERATIVE DIAGNOSIS:  Primary osteoarthritis and degenerative joint disease, right hip.  POSTOPERATIVE DIAGNOSIS:  Primary osteoarthritis and degenerative joint disease, right hip.  PROCEDURE:  Right total hip arthroplasty through a direct anterior approach.  IMPLANTS:  DePuy sector Gription acetabular component, size 52 with a single screw, size 36+0 neutral polyethylene liner, size 13 Corail femoral component with standard offset, size 36+15 ceramic hip ball.  SURGEON:  Doneen Poisson, M.D.   ASSISTANT: Richardean Canal, PA-C  ANESTHESIA:  Spinal.  ANTIBIOTICS:  2 g IV Ancef.  ESTIMATED BLOOD LOSS:  550 mL  COMPLICATIONS:  None.  INDICATIONS:  The patient is a 60 year old active female with debilitating arthritis involving her right hip.  She has had detrimental impact on her quality of life and activities of daily living and mobility due to the severity of her right hip  osteoarthritis.  She used to be significantly morbidly obese with a BMI over 40.  She has lost significant weight and has gotten her BMI below 40.  She is definitely a candidate for hip replacement surgery given the severity of arthritis.  We had a long  and thorough discussion about the risk of acute blood loss anemia, nerve or vessel injury, fracture, infection, dislocation, DVT, implant failure and skin and soft tissue issues.  We talked about our goals being decreased pain, improve mobility and  overall improve quality of life.  DESCRIPTION OF PROCEDURE:  After informed consent was obtained, appropriate right hip was marked.  She was brought to the operating room and sat up on a stretcher where spinal anesthesia was obtained, Foley catheter was placed.  Traction boots were   placed on both her feet.  Next, she was placed supine on the Hana fracture table with a perineal post in place and both legs in line skeletal traction, but no traction applied.  Her right operative hip was prepped and draped with DuraPrep and sterile  drapes.  A timeout was called.  She was identified correct patient, correct right hip.  I then made an incision just inferior and posterior to the anterior superior iliac spine and carried this obliquely down the leg.  We dissected down tensor fascia  lata muscle.  Tensor fascia was then divided longitudinally to proceed with a direct approach to the hip.  We identified and cauterized circumflex vessels, we then identified the hip capsule opened up L-type format finding a very large joint effusion and  significant periarticular osteophytes around the femoral head and neck, placed Cobra retractors within the joint capsule around the medial and lateral femoral neck and made a femoral neck cut with oscillating saw just proximal to the lesser trochanter  and completed this with an osteotome.  I placed a corkscrew guide in the femoral head and removed the femoral head its entirety and found to be flattened and significant periarticular osteophytes.  Of note, the patient's femoral neck is valgus and her  hip is almost in the subluxed type position.  We then placed a bent Hohmann over the medial acetabular rim and removed remnants of the acetabular labrum and other debris.  We then began reaming under direct visualization from a size 43 reamer going all  the way up to size 51 with all reamers placed under direct visualization.  Last reamer was also placed  under direct fluoroscopy, so we could obtain our depth in reaming, our inclination and anteversion.  I then placed the real DePuy sector Gription  acetabular component size 52.  Due to some overhang, I felt it was appropriate to place a 25 mm screw in the acetabular dome.  I then placed a 36+0 neutral polyethylene  liner.  Attention was then turned to the femur with the leg externally rotated 120  degrees extended and adducted.  We were able to place a Mueller retractor medially and Hohman retractor behind the greater trochanter released lateral joint capsule and used a box cutting osteotome to enter the femoral canal and a rongeur to lateralize.   We then began broaching using Corail broaching system from a size 8 going up to a size 13.  With a size 13 in place, we trialed a standard offset femoral neck and a 36+15 hip ball, reduced this in acetabulum.  We were pleased with range of motion, leg  length, offset and stability assessed mechanically and radiographically.  We then dislocated the hip and removed the trial components.  We placed the real Corail femoral component, size 13 with standard offset and the real 36+15 ceramic hip ball and  again reduced this in the acetabulum.  We were pleased with stability.  We then irrigated the soft tissue with normal saline solution using pulsatile lavage.  We assessed the leg radiographically and mechanically again.  We then closed the joint capsule  with interrupted #1 Ethibond suture followed by running #1 Vicryl to close the tensor fascia, 0 Vicryl was used to close the deep tissue and 2-0 Vicryl was used to close the subcutaneous tissue.  The skin was closed with staples.  An Aquacel dressing was  applied.  She was taken off the Hana table and taken to recovery in stable condition with all final counts being correct.  No complications noted.  Of note, Rexene Edison, PA-C did assist during the entire case and assistance was crucial for facilitating  all aspects of this case.   [email or web address] D: 08/28/2020 11:38:35 am T: 08/29/2020 4:45:00 am  JOB: [id number]/ [id number]

## 2020-08-29 NOTE — Progress Notes (Signed)
Subjective: 1 Day Post-Op Procedure(s) (LRB): RIGHT TOTAL HIP ARTHROPLASTY ANTERIOR APPROACH (Right) Patient reports pain as moderate.    Objective: Vital signs in last 24 hours: Temp:  [96.5 F (35.8 C)-99.4 F (37.4 C)] 98.1 F (36.7 C) (05/14 1009) Pulse Rate:  [51-70] 67 (05/14 1009) Resp:  [12-20] 20 (05/14 1009) BP: (109-151)/(68-87) 111/76 (05/14 1009) SpO2:  [96 %-100 %] 100 % (05/14 1009)  Intake/Output from previous day: 05/13 0701 - 05/14 0700 In: 500 [I.V.:400; IV Piggyback:100] Out: 4050 [Urine:3500; Blood:550] Intake/Output this shift: Total I/O In: -  Out: 300 [Urine:300]  Recent Labs    08/29/20 0310  HGB 11.8*   Recent Labs    08/29/20 0310  WBC 7.5  RBC 3.68*  HCT 36.0  PLT 207   Recent Labs    08/29/20 0310  NA 138  K 3.8  CL 102  CO2 32  BUN 11  CREATININE 0.50  GLUCOSE 119*  CALCIUM 9.2   No results for input(s): LABPT, INR in the last 72 hours.  Sensation intact distally Intact pulses distally Dorsiflexion/Plantar flexion intact Incision: dressing C/D/I   Assessment/Plan: 1 Day Post-Op Procedure(s) (LRB): RIGHT TOTAL HIP ARTHROPLASTY ANTERIOR APPROACH (Right) Up with therapy Discharge home with home health      Jean Tanner 08/29/2020, 11:04 AM

## 2020-08-29 NOTE — Progress Notes (Signed)
Physical Therapy Treatment Patient Details Name: Jean Tanner MRN: [id number] DOB: [birth year] Today's Date: 08/29/2020    History of Present Illness Pt s/p R THR    PT Comments    Pt continues to progress well with mobility and eager for dc home this date.  Pt up to ambulate in hall, negotiated stair, reviewed car transfers and reviewed written HEP program.    Follow Up Recommendations  Home health PT     Equipment Recommendations  None recommended by PT    Recommendations for Other Services       Precautions / Restrictions Precautions Precautions: Fall Restrictions Weight Bearing Restrictions: No RLE Weight Bearing: Weight bearing as tolerated    Mobility  Bed Mobility Overal bed mobility: Needs Assistance Bed Mobility: Sit to Supine       Sit to supine: Min guard   General bed mobility comments: Cues for sequence and use of L LE to self assist. Pt using gait belt to assist R LE    Transfers Overall transfer level: Needs assistance Equipment used: Rolling walker (2 wheeled) Transfers: Sit to/from Stand Sit to Stand: Supervision         General transfer comment: cues for LE management and use of UEs to self assist  Ambulation/Gait Ambulation/Gait assistance: Min guard;Supervision Gait Distance (Feet): 140 Feet Assistive device: Rolling walker (2 wheeled) Gait Pattern/deviations: Step-to pattern;Decreased step length - right;Decreased step length - left;Shuffle;Trunk flexed Gait velocity: decr   General Gait Details: min cues for sequence, posture and position from RW - no c/o nausea   Stairs Stairs: Yes   Stair Management: No rails Number of Stairs: 1 General stair comments: cues for sequence; single step bkwd to use stool to get into higher bed   Wheelchair Mobility    Modified Rankin (Stroke Patients Only)       Balance Overall balance assessment: Needs assistance Sitting-balance support: No upper extremity supported;Feet  supported Sitting balance-Leahy Scale: Good     Standing balance support: No upper extremity supported Standing balance-Leahy Scale: Fair                              Cognition Arousal/Alertness: Awake/alert Behavior During Therapy: WFL for tasks assessed/performed Overall Cognitive Status: Within Functional Limits for tasks assessed                                        Exercises      General Comments        Pertinent Vitals/Pain Pain Assessment: 0-10 Pain Score: 4  Pain Location: R hip Pain Descriptors / Indicators: Aching;Sore Pain Intervention(s): Limited activity within patient's tolerance;Monitored during session;Premedicated before session;Ice applied    Home Living                      Prior Function            PT Goals (current goals can now be found in the care plan section) Acute Rehab PT Goals Patient Stated Goal: Regain IND PT Goal Formulation: With patient Time For Goal Achievement: 09/04/20 Potential to Achieve Goals: Good Progress towards PT goals: Progressing toward goals    Frequency    7X/week      PT Plan Current plan remains appropriate    Co-evaluation  AM-PAC PT "6 Clicks" Mobility   Outcome Measure  Help needed turning from your back to your side while in a flat bed without using bedrails?: A Little Help needed moving from lying on your back to sitting on the side of a flat bed without using bedrails?: A Little Help needed moving to and from a bed to a chair (including a wheelchair)?: A Little Help needed standing up from a chair using your arms (e.g., wheelchair or bedside chair)?: A Little Help needed to walk in hospital room?: A Little Help needed climbing 3-5 steps with a railing? : A Little 6 Click Score: 18    End of Session Equipment Utilized During Treatment: Gait belt Activity Tolerance: Patient tolerated treatment well Patient left: in bed;with call bell/phone  within reach;with family/visitor present Nurse Communication: Mobility status PT Visit Diagnosis: Difficulty in walking, not elsewhere classified (R26.2)     Time: 9201-0071 PT Time Calculation (min) (ACUTE ONLY): 26 min  Charges:  $Gait Training: 8-22 mins $Therapeutic Activity: 8-22 mins                     Mauro Kaufmann PT Acute Rehabilitation Services Pager [phone] Office [phone]    Itxel Wickard 08/29/2020, 12:49 PM

## 2020-08-29 NOTE — Discharge Instructions (Signed)

## 2020-08-29 NOTE — TOC Transition Note (Addendum)
Transition of Care [facility]) - CM/SW Discharge Note   Patient Details  Name: Jean Tanner MRN: [id number] Date of Birth: [birth year]  [facility] of [facility]) CM/SW Contact:  Golda Acre, RN Phone Number: 08/29/2020, 12:45 PM   Clinical Narrative:     [facility] who has [facility] this [facility].  Pt has no insurance.  [facility] refused [facility] wants to use [employer] willing to self pay text sent to [facility] with [employer].       Patient Goals and CMS Choice        Discharge Placement                       Discharge Plan and Services                                     Social Determinants of Health (SDOH) Interventions     Readmission Risk Interventions No flowsheet data found.

## 2020-08-29 NOTE — Plan of Care (Signed)
  Problem: Education: Goal: Knowledge of General Education information will improve Description: Including pain rating scale, medication(s)/side effects and non-pharmacologic comfort measures Outcome: Progressing   Problem: Health Behavior/Discharge Planning: Goal: Ability to manage health-related needs will improve Outcome: Progressing   Problem: Pain Managment: Goal: General experience of comfort will improve Outcome: Progressing   

## 2020-08-29 NOTE — Progress Notes (Signed)
Physical Therapy Treatment Patient Details Name: Jean Tanner MRN: [id number] DOB: [birth year] Today's Date: 08/29/2020    History of Present Illness Pt s/p R THR    PT Comments    Pt in good spirits and with marked improvement in activity tolerance noted.  Pt hoping for dc home this date.   Follow Up Recommendations  Home health PT     Equipment Recommendations  None recommended by PT    Recommendations for Other Services       Precautions / Restrictions Precautions Precautions: Fall Restrictions Weight Bearing Restrictions: No RLE Weight Bearing: Weight bearing as tolerated Other Position/Activity Restrictions: WBAT    Mobility  Bed Mobility Overal bed mobility: Needs Assistance Bed Mobility: Supine to Sit     Supine to sit: Min guard     General bed mobility comments: Cues for sequence and use of L LE to self assist. Pt using gait belt to assist R LE    Transfers Overall transfer level: Needs assistance Equipment used: Rolling walker (2 wheeled) Transfers: Sit to/from Stand Sit to Stand: Min guard         General transfer comment: cues for LE management and use of UEs to self assist  Ambulation/Gait Ambulation/Gait assistance: Min guard Gait Distance (Feet): 140 Feet Assistive device: Rolling walker (2 wheeled) Gait Pattern/deviations: Step-to pattern;Decreased step length - right;Decreased step length - left;Shuffle;Trunk flexed Gait velocity: decr   General Gait Details: min cues for sequence, posture and position from RW - no c/o nausea   Stairs             Wheelchair Mobility    Modified Rankin (Stroke Patients Only)       Balance Overall balance assessment: Needs assistance Sitting-balance support: No upper extremity supported;Feet supported Sitting balance-Leahy Scale: Good     Standing balance support: No upper extremity supported Standing balance-Leahy Scale: Fair                              Cognition  Arousal/Alertness: Awake/alert Behavior During Therapy: WFL for tasks assessed/performed Overall Cognitive Status: Within Functional Limits for tasks assessed                                        Exercises Total Joint Exercises Ankle Circles/Pumps: AROM;Both;15 reps;Supine Quad Sets: AROM;Both;10 reps;Supine Heel Slides: AAROM;Right;20 reps;Supine Hip ABduction/ADduction: AAROM;Right;10 reps;Supine    General Comments        Pertinent Vitals/Pain Pain Assessment: 0-10 Pain Score: 2  Pain Location: R hip Pain Descriptors / Indicators: Aching;Sore Pain Intervention(s): Limited activity within patient's tolerance;Monitored during session;Premedicated before session;Ice applied    Home Living                      Prior Function            PT Goals (current goals can now be found in the care plan section) Acute Rehab PT Goals Patient Stated Goal: Regain IND PT Goal Formulation: With patient Time For Goal Achievement: 09/04/20 Potential to Achieve Goals: Good Progress towards PT goals: Progressing toward goals    Frequency    7X/week      PT Plan Current plan remains appropriate    Co-evaluation              AM-PAC PT "6 Clicks" Mobility   Outcome  Measure  Help needed turning from your back to your side while in a flat bed without using bedrails?: A Little Help needed moving from lying on your back to sitting on the side of a flat bed without using bedrails?: A Little Help needed moving to and from a bed to a chair (including a wheelchair)?: A Little Help needed standing up from a chair using your arms (e.g., wheelchair or bedside chair)?: A Little Help needed to walk in hospital room?: A Little Help needed climbing 3-5 steps with a railing? : A Lot 6 Click Score: 17    End of Session Equipment Utilized During Treatment: Gait belt Activity Tolerance: Patient tolerated treatment well Patient left: in chair;with call bell/phone  within reach Nurse Communication: Mobility status PT Visit Diagnosis: Difficulty in walking, not elsewhere classified (R26.2)     Time: 5953-9672 PT Time Calculation (min) (ACUTE ONLY): 30 min  Charges:  $Gait Training: 8-22 mins $Therapeutic Exercise: 8-22 mins                     Mauro Kaufmann PT Acute Rehabilitation Services Pager [phone] Office [phone]    Isay Perleberg 08/29/2020, 8:49 AM

## 2020-08-30 ENCOUNTER — Encounter: Payer: Self-pay | Admitting: Orthopaedic Surgery

## 2020-08-31 ENCOUNTER — Encounter (HOSPITAL_COMMUNITY): Payer: Self-pay | Admitting: Orthopaedic Surgery

## 2020-08-31 ENCOUNTER — Telehealth: Payer: Self-pay

## 2020-08-31 NOTE — Telephone Encounter (Signed)
Misty [facility] from [facility] called she stated she received a script Via fax regarding patients total hip replacement and to set up home health, Misty Stanley stated they are located in [place], did the patient move to [place]? If so Misty Stanley is requesting patients demographic sheet to go along with the script [phone] call ack:[phone]

## 2020-08-31 NOTE — Telephone Encounter (Signed)
I have sent her a MyChart message asking her about this

## 2020-09-02 ENCOUNTER — Other Ambulatory Visit: Payer: Self-pay | Admitting: Orthopaedic Surgery

## 2020-09-02 MED ORDER — OXYCODONE HCL 5 MG PO TABS: 5.0000 mg | ORAL_TABLET | Freq: Four times a day (QID) | ORAL | 0 refills | Status: AC | PRN

## 2020-09-04 ENCOUNTER — Encounter: Payer: Self-pay | Admitting: Family Medicine

## 2020-09-04 ENCOUNTER — Other Ambulatory Visit: Payer: Self-pay | Admitting: Family Medicine

## 2020-09-04 MED ORDER — TRAMADOL HCL ER 100 MG PO TB24: 100.0000 mg | ORAL_TABLET | Freq: Every day | ORAL | 0 refills | Status: DC

## 2020-09-04 MED ORDER — TRAMADOL HCL 50 MG PO TABS: 100.0000 mg | ORAL_TABLET | Freq: Three times a day (TID) | ORAL | 0 refills | Status: DC | PRN

## 2020-09-10 ENCOUNTER — Encounter: Payer: Self-pay | Admitting: Orthopaedic Surgery

## 2020-09-10 ENCOUNTER — Ambulatory Visit (INDEPENDENT_AMBULATORY_CARE_PROVIDER_SITE_OTHER): Payer: Self-pay | Admitting: Orthopaedic Surgery

## 2020-09-10 DIAGNOSIS — Z96641 Presence of right artificial hip joint: Secondary | ICD-10-CM

## 2020-09-10 NOTE — Progress Notes (Signed)
The patient is 2 weeks status post a right total hip arthroplasty.  She is doing great.  She is barely using her cane ambulating.  She is already driven and off of any pain medication.  She actually stopped taking aspirin as well because she has been so immobile.  She denies any calf pain or foot and ankle swelling at all.  Her right hip incision looks good.  Remove the staples in place Steri-Strips.  Her leg lengths are equal.  I am amazing how she is doing overall.  I would like her to continue to increase her activity slowly as comfort allows.  We will see her back in 4 weeks for repeat exam but no x-rays are needed.  All questions and concerns were answered and addressed.

## 2020-10-08 ENCOUNTER — Encounter: Payer: Self-pay | Admitting: Orthopaedic Surgery

## 2020-10-08 ENCOUNTER — Ambulatory Visit (INDEPENDENT_AMBULATORY_CARE_PROVIDER_SITE_OTHER): Payer: Self-pay | Admitting: Orthopaedic Surgery

## 2020-10-08 DIAGNOSIS — Z96641 Presence of right artificial hip joint: Secondary | ICD-10-CM

## 2020-10-08 MED ORDER — TRAMADOL HCL 50 MG PO TABS: 50.0000 mg | ORAL_TABLET | Freq: Three times a day (TID) | ORAL | 0 refills | Status: DC | PRN

## 2020-10-08 NOTE — Progress Notes (Signed)
The patient is now 6-week status post a right total hip arthroplasty.  She is doing well overall.  She is only taking tramadol for pain and needs a refill of this.  She says stairs are still difficult but she does report increased strength and range of motion with the right hip.  She still has some mild swelling as well.  She is walking without an assistive device and has a normal-appearing gait.  She tolerates me easily putting her right operative hip to rotation.  The incision looks good.  The swelling is appropriate.  She will continue to increase her activities as comfort allows.  I will refill her tramadol.  The next time I need to see her back is 3 months from now.  At that visit I like a standing low AP pelvis and lateral of her right operative hip.

## 2020-10-13 ENCOUNTER — Encounter: Payer: Self-pay | Admitting: Family Medicine

## 2020-10-13 MED ORDER — TRAMADOL HCL 50 MG PO TABS: 50.0000 mg | ORAL_TABLET | Freq: Three times a day (TID) | ORAL | 0 refills | Status: DC | PRN

## 2020-11-23 ENCOUNTER — Other Ambulatory Visit: Payer: Self-pay | Admitting: Family Medicine

## 2020-11-23 MED ORDER — TRAMADOL HCL 50 MG PO TABS: 50.0000 mg | ORAL_TABLET | Freq: Three times a day (TID) | ORAL | 0 refills | Status: AC | PRN

## 2020-12-07 ENCOUNTER — Telehealth: Payer: Self-pay

## 2020-12-07 NOTE — Telephone Encounter (Signed)
error 

## 2020-12-10 ENCOUNTER — Encounter: Payer: Self-pay | Admitting: Orthopaedic Surgery

## 2020-12-16 ENCOUNTER — Encounter: Payer: Self-pay | Admitting: Physician Assistant

## 2020-12-16 ENCOUNTER — Ambulatory Visit (INDEPENDENT_AMBULATORY_CARE_PROVIDER_SITE_OTHER): Payer: Self-pay

## 2020-12-16 ENCOUNTER — Ambulatory Visit (INDEPENDENT_AMBULATORY_CARE_PROVIDER_SITE_OTHER): Payer: Self-pay | Admitting: Physician Assistant

## 2020-12-16 DIAGNOSIS — Z96641 Presence of right artificial hip joint: Secondary | ICD-10-CM

## 2020-12-16 NOTE — Progress Notes (Signed)
HPI: Jean Tanner returns today status post right total hip arthroplasty 08/28/2020.  Patient overall states she still having some pain lateral aspect of the right hip down to the knee.  No known injuries.  She feels she may have possibly overdone it as she has been doing a lot of yard work.  She is having no real numbness tingling down the leg.  Review of systems: See HPI otherwise negative  Physical exam: General: Well-developed well-nourished female no acute distress ambulates without any assistive device.  Has slight antalgic gait when she first begins to ambulate with his quickly resolves. Right hip she has excellent range of motion of the right hip without pain.  No tenderness over the trochanteric region.  Leg lengths are equal on exam  Radiographs: AP pelvis lateral view of the right hip.  Both hips well located.  No acute fractures, bony abnormalities, or hardware failure.  Right hip appears well-seated.  Impression: Status post right total hip arthroplasty  Plan: We will have her work on IT band stretching exercises.  See her back in 3 months if she still having problems sooner her pain becomes progressively worse.  If she needs doing well in 3 months she can cancel that appointment but would like to see her back at the 1 year mark and at that point we will obtain a AP pelvis and lateral view of the right hip.  Questions were encouraged and answered at length.

## 2021-04-01 ENCOUNTER — Ambulatory Visit: Payer: Self-pay | Admitting: Orthopaedic Surgery

## 2022-01-22 IMAGING — RF DG HIP (WITH PELVIS) OPERATIVE*R*
1 series · 3 of 3 positions shown · non-contrast
Comparison: 06/11/2020

CLINICAL DATA: Right hip replacement

EXAM:
OPERATIVE RIGHT HIP (WITH PELVIS IF PERFORMED) 3 VIEWS
TECHNIQUE: Fluoroscopic spot image(s) were submitted for interpretation
post-operatively.

[Series 1: unknown protocol · 0.20mm/px · 3 of 3 slices shown]
[im 1/3]
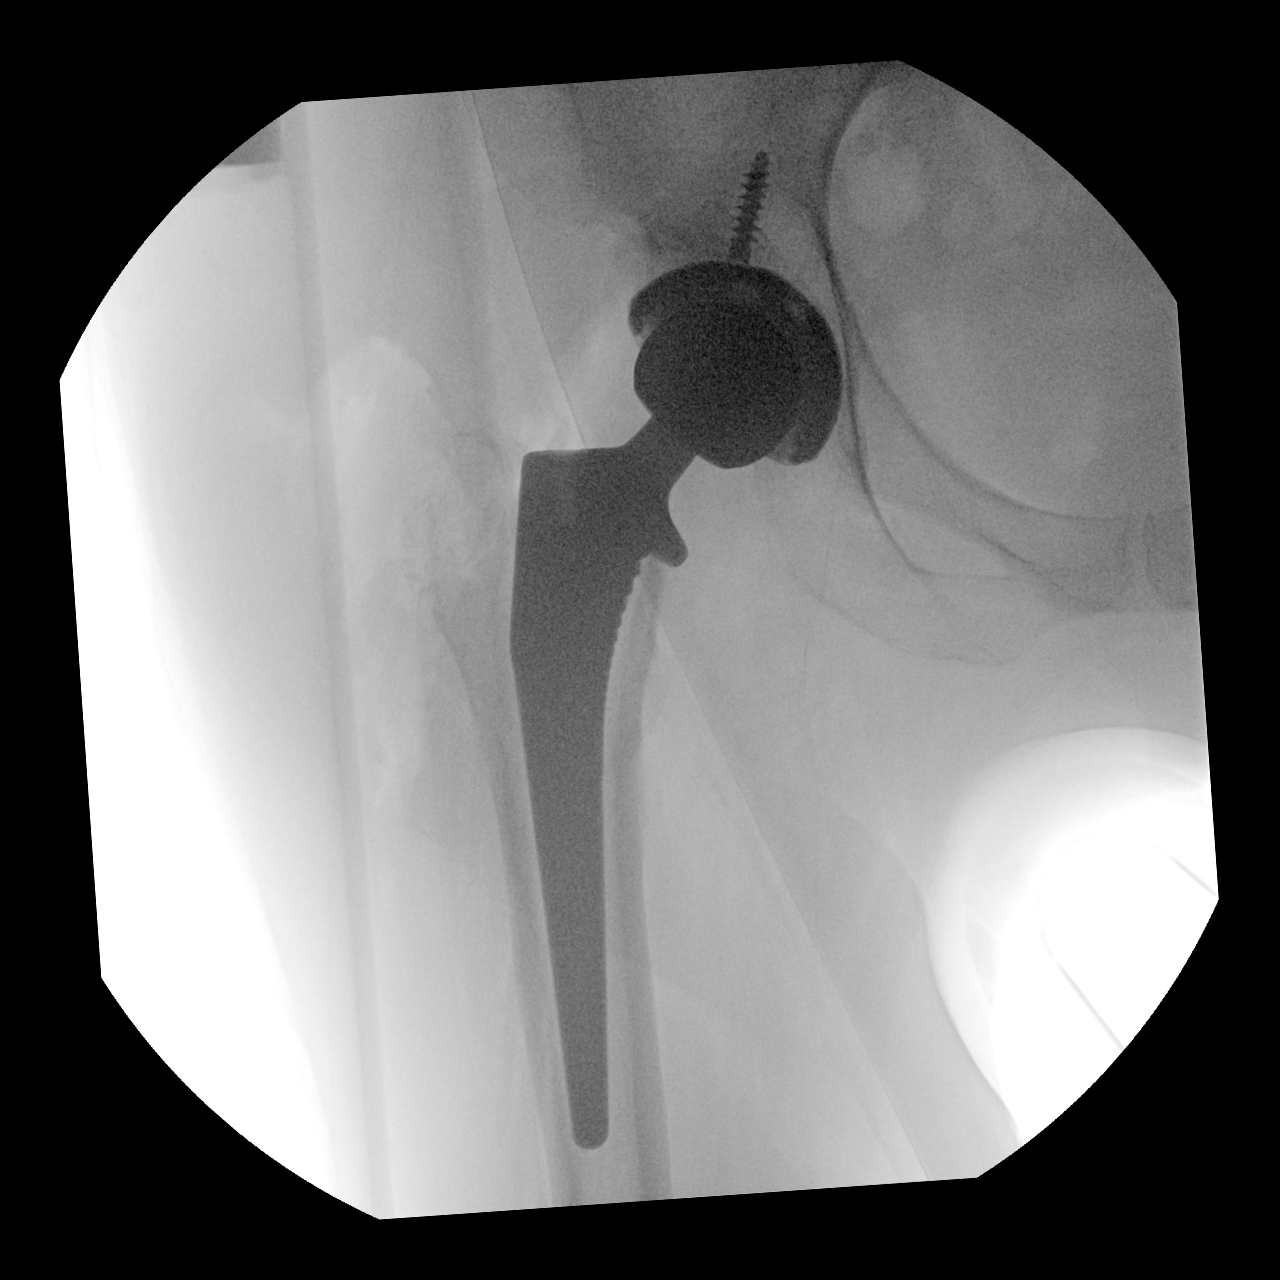
[im 2/3]
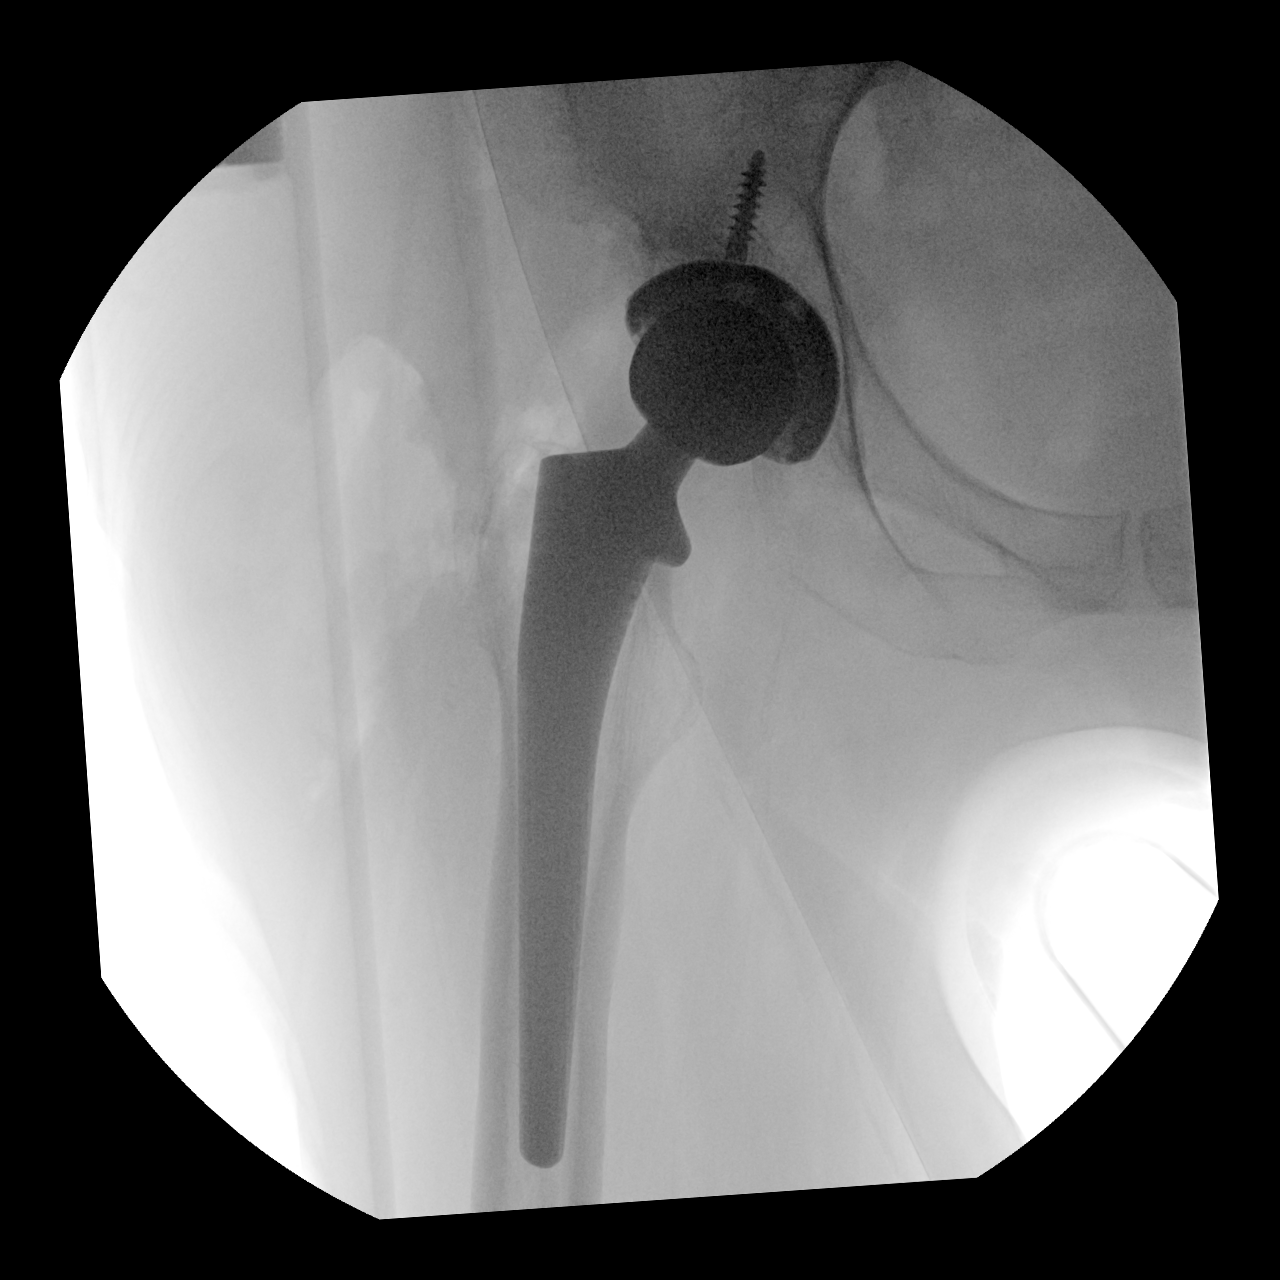
[im 3/3]
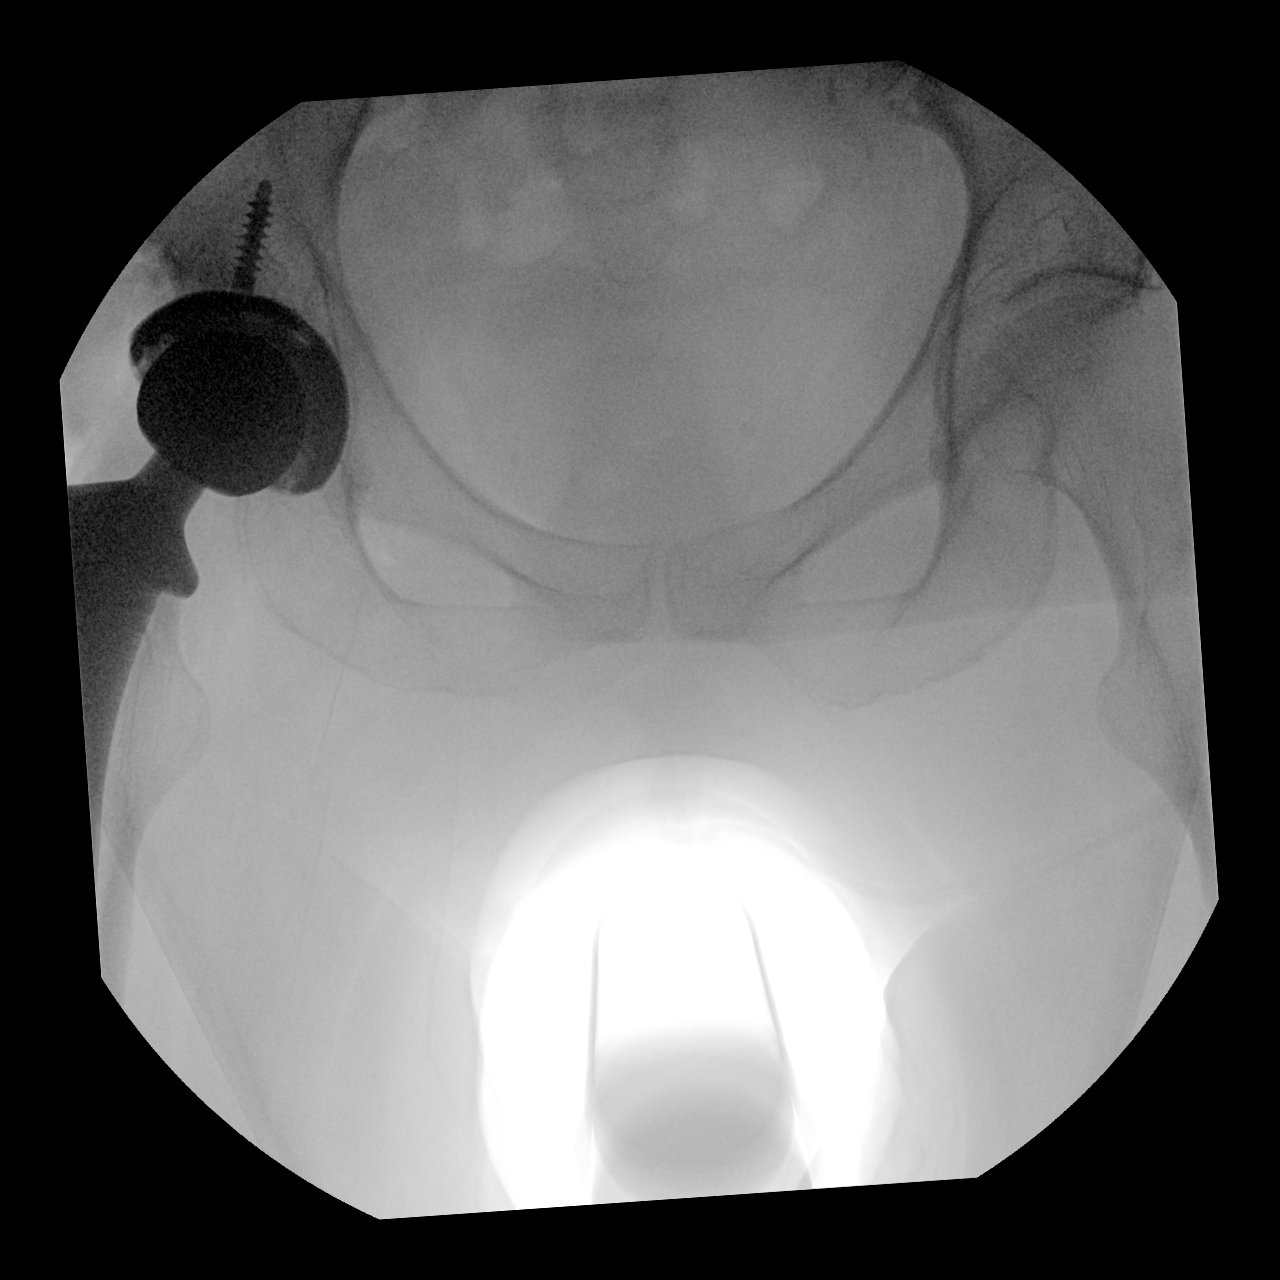

[3 of 3 positions shown; findings below may reference images not displayed]

FINDINGS: Changes of right hip replacement. Normal alignment. No hardware bony
complicating feature.
IMPRESSION: Right hip replacement.  No visible complicating feature.
